# Patient Record
Sex: Male | Born: 1995
Health system: Southern US, Community
[De-identification: ages and names within clinical notes are randomized; demographics above are authoritative.]

## PROBLEM LIST (undated history)

## (undated) DIAGNOSIS — N200 Calculus of kidney: Secondary | ICD-10-CM

---
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
## 2017-01-17 ENCOUNTER — Encounter (HOSPITAL_COMMUNITY): Payer: Self-pay | Admitting: Emergency Medicine

## 2017-01-17 ENCOUNTER — Emergency Department (HOSPITAL_COMMUNITY): Payer: Managed Care, Other (non HMO)

## 2017-01-17 ENCOUNTER — Emergency Department (HOSPITAL_COMMUNITY)
Admission: EM | Admit: 2017-01-17 | Discharge: 2017-01-17 | Disposition: A | Discharge status: Home / Self Care | Payer: Managed Care, Other (non HMO) | Attending: Emergency Medicine | Admitting: Emergency Medicine

## 2017-01-17 DIAGNOSIS — Y9301 Activity, walking, marching and hiking: Secondary | ICD-10-CM | POA: Insufficient documentation

## 2017-01-17 DIAGNOSIS — Y929 Unspecified place or not applicable: Secondary | ICD-10-CM | POA: Insufficient documentation

## 2017-01-17 DIAGNOSIS — S92355A Nondisplaced fracture of fifth metatarsal bone, left foot, initial encounter for closed fracture: Secondary | ICD-10-CM | POA: Insufficient documentation

## 2017-01-17 DIAGNOSIS — X509XXA Other and unspecified overexertion or strenuous movements or postures, initial encounter: Secondary | ICD-10-CM | POA: Insufficient documentation

## 2017-01-17 DIAGNOSIS — Y999 Unspecified external cause status: Secondary | ICD-10-CM | POA: Insufficient documentation

## 2017-01-17 DIAGNOSIS — S93402A Sprain of unspecified ligament of left ankle, initial encounter: Secondary | ICD-10-CM

## 2017-01-17 MED ORDER — HYDROCODONE-ACETAMINOPHEN 5-325 MG PO TABS
1.0000 | ORAL_TABLET | Freq: Once | ORAL | Status: AC
  Administered 2017-01-17: 1 via ORAL
  Filled 2017-01-17: qty 1

## 2017-01-17 MED ORDER — IBUPROFEN 800 MG PO TABS: 800.0000 mg | ORAL_TABLET | Freq: Three times a day (TID) | ORAL | 0 refills | Status: DC | PRN

## 2017-01-17 MED ORDER — HYDROCODONE-ACETAMINOPHEN 5-325 MG PO TABS: 1.0000 | ORAL_TABLET | ORAL | 0 refills | Status: DC | PRN

## 2017-01-17 NOTE — ED Triage Notes (Signed)
Pt reports walking and stepped into ditch and felt left ankle pop. Pt states it feels like something is poking on side of foot. Pulses and sensation present.

## 2017-01-17 NOTE — Discharge Instructions (Signed)
Use ibuprofen up to 3 times daily as needed for mild to moderate pain. Take this with meals. Do not use other anti-inflammatories at the same time (Advil, Motrin, naproxen, Aleve). You may supplement with Tylenol.  You may use Norco as needed for severe pain. Use crutches to remain nonweightbearing until seen by orthopedics. Follow-up with orthopedics within one week for further evaluation of your foot.  Return to the emergency department if you develop numbness, tingling, or any new or worsening symptoms.

## 2017-01-18 NOTE — ED Provider Notes (Signed)
[facility]-EMERGENCY DEPT Provider Note   CSN: [id number] Arrival date & time: 01/17/17  1901     History   Chief Complaint Chief Complaint  Patient presents with  . Ankle Pain    HPI Francis Reyes is a 21 y.o. male presenting with pain and swelling of left ankle.  Patient states that he was walking home when he externally stepped into a ditch, and his foot inverted. He heard a pop on the lateral aspect of his left foot. Since then, he has had increasing pain of his left lateral foot and ankle. He denies numbness or tingling. He states he is unable to bear weight due to pain. He denies injury elsewhere. He has not taken anything for pain, and has not used any ice. While sitting still, patient is still in pain, although less than when he tries to bear weight or move his ankle.   HPI  Past Medical History:  Diagnosis Date  . Kidney stones     There are no active problems to display for this patient.   History reviewed. No pertinent surgical history.     Home Medications    Prior to Admission medications   Medication Sig Start Date End Date Taking? Authorizing Provider  HYDROcodone-acetaminophen (NORCO/VICODIN) 5-325 MG tablet Take 1 tablet by mouth every 4 (four) hours as needed for severe pain. 01/17/17   Harmonie Verrastro, PA-C  ibuprofen (ADVIL,MOTRIN) 800 MG tablet Take 1 tablet (800 mg total) by mouth 3 (three) times daily with meals as needed. 01/17/17   Cove Haydon, PA-C    Family History History reviewed. No pertinent family history.  Social History Social History  Substance Use Topics  . Smoking status: Never Smoker  . Smokeless tobacco: Never Used  . Alcohol use No     Allergies   Patient has no known allergies.   Review of Systems Review of Systems  Musculoskeletal: Positive for arthralgias and joint swelling.  Neurological: Negative for numbness.     Physical Exam Updated Vital Signs BP 136/72 (BP Location: Left Arm)   Pulse 78   Temp  98.2 F (36.8 C) (Oral)   Resp 14   Ht 6\' 1"  (1.854 m)   Wt 115.7 kg (255 lb)   SpO2 98%   BMI 33.64 kg/m   Physical Exam  Constitutional: He is oriented to person, place, and time. He appears well-developed and well-nourished. No distress.  HENT:  Head: Normocephalic and atraumatic.  Eyes: Pupils are equal, round, and reactive to light.  Neck: Normal range of motion.  Cardiovascular: Normal rate and regular rhythm.   Pulmonary/Chest: Effort normal and breath sounds normal.  Musculoskeletal:  Patient with obvious swelling of lateral left foot and surrounding the lateral malleolus of the left ankle. No obvious ecchymosis. Tenderness to palpation of the lateral foot and ankle. No tenderness to palpation of plantar aspect of the foot, toes, Achilles tendon, or medial foot. Decreased range of motion of the ankle due to pain. Full range of motion of toes with minimal pain. Pulses intact bilaterally. Color and warmth equal bilaterally. Sensation intact bilaterally. Compartments soft.  Neurological: He is alert and oriented to person, place, and time. No sensory deficit.  Skin: Skin is warm.  Nursing note and vitals reviewed.    ED Treatments / Results  Labs (all labs ordered are listed, but only abnormal results are displayed) Labs Reviewed - No data to display  EKG  EKG Interpretation None       Radiology Dg Ankle  Complete Left  Result Date: 01/17/2017 CLINICAL DATA:  21 year old male with pain and swelling of the ankle after he tripped in a ditch earlier tonight. EXAM: LEFT ANKLE COMPLETE - 3+ VIEW COMPARISON:  Concurrently obtained radiographs of the left foot FINDINGS: Acute displaced avulsion fracture from the base of the fifth metatarsal. The distal fibula and tibia appear intact. The ankle mortise is congruent in the talar dome is intact. There is diffuse soft tissue swelling about the dorsal and lateral aspect of the ankle and hindfoot. IMPRESSION: Acute displaced avulsion  fracture from the base of the fifth metatarsal. Electronically Signed   By: Malachy MoanHeath  McCullough M.D.   On: 01/17/2017 20:32   Dg Foot Complete Left  Result Date: 01/17/2017 CLINICAL DATA:  21 year old male with left foot pain after tripping in a ditch beside the road earlier tonight EXAM: LEFT FOOT - COMPLETE 3+ VIEW COMPARISON:  Concurrently obtained radiographs of the left ankle FINDINGS: Acute and mildly comminuted fracture at the base of the fifth metatarsal consistent with an avulsion type injury from the peroneal tendons. The remaining visualized bones and joints are intact and unremarkable. Associated soft tissue is present over the lateral and dorsal aspect of the foot. IMPRESSION: Acute comminuted avulsion fracture from the base of the fifth metatarsal. Electronically Signed   By: Malachy MoanHeath  McCullough M.D.   On: 01/17/2017 20:33    Procedures Procedures (including critical care time)  Medications Ordered in ED Medications  HYDROcodone-acetaminophen (NORCO/VICODIN) 5-325 MG per tablet 1 tablet (1 tablet Oral Given 01/17/17 2246)     Initial Impression / Assessment and Plan / ED Course  I have reviewed the triage vital signs and the nursing notes.  Pertinent labs & imaging results that were available during my care of the patient were reviewed by me and considered in my medical decision making (see chart for details).     Patient presenting with left foot and ankle pain following inversion of the foot. Physical exam with swelling and tenderness to this area. X-ray showed avulsion fracture fifth metatarsal. Physical exam without tenderness to the plantar aspect of the foot, and low concern for Lisfranc at this time. Did not show any further fracture or injury to the ankle or fibula. Patient likely with concurrent ankle sprain. Patient neurovascularly intact with soft compartments. Will put patient in Cam Walker and crutches for nonweightbearing until seen by orthopedics. One dose of pain  medicine given here, and short course prescribed for home use. Patient told to use ibuprofen as needed for mild to moderate pain. Patient follow-up with orthopedics for further evaluation of his ankle. Findings discussed with patient. Return precautions given. Patient states he understands and agrees to plan.  Final Clinical Impressions(s) / ED Diagnoses   Final diagnoses:  Nondisp fx of fifth metatarsal bone, left foot, init  Sprain of left ankle, unspecified ligament, initial encounter    New Prescriptions Discharge Medication List as of 01/17/2017 10:42 PM    START taking these medications   Details  HYDROcodone-acetaminophen (NORCO/VICODIN) 5-325 MG tablet Take 1 tablet by mouth every 4 (four) hours as needed for severe pain., Starting Mon 01/17/2017, Print    ibuprofen (ADVIL,MOTRIN) 800 MG tablet Take 1 tablet (800 mg total) by mouth 3 (three) times daily with meals as needed., Starting Mon 01/17/2017, Print         [place] Madera[place]avale, [place]villeSoph[place], PA-C 01/18/17 0158    Nira Connardama, Pedro Eduardo, MD 01/18/17 [phone]

## 2018-03-16 IMAGING — CR DG ANKLE COMPLETE 3+V*L*
3 series · 3 of 3 positions shown · non-contrast
Comparison: Concurrently obtained radiographs of the left foot

CLINICAL DATA: 20-year-old male with pain and swelling of the ankle
after he tripped in a ditch earlier tonight.

EXAM:
LEFT ANKLE COMPLETE - 3+ VIEW

[x ankle lat left]
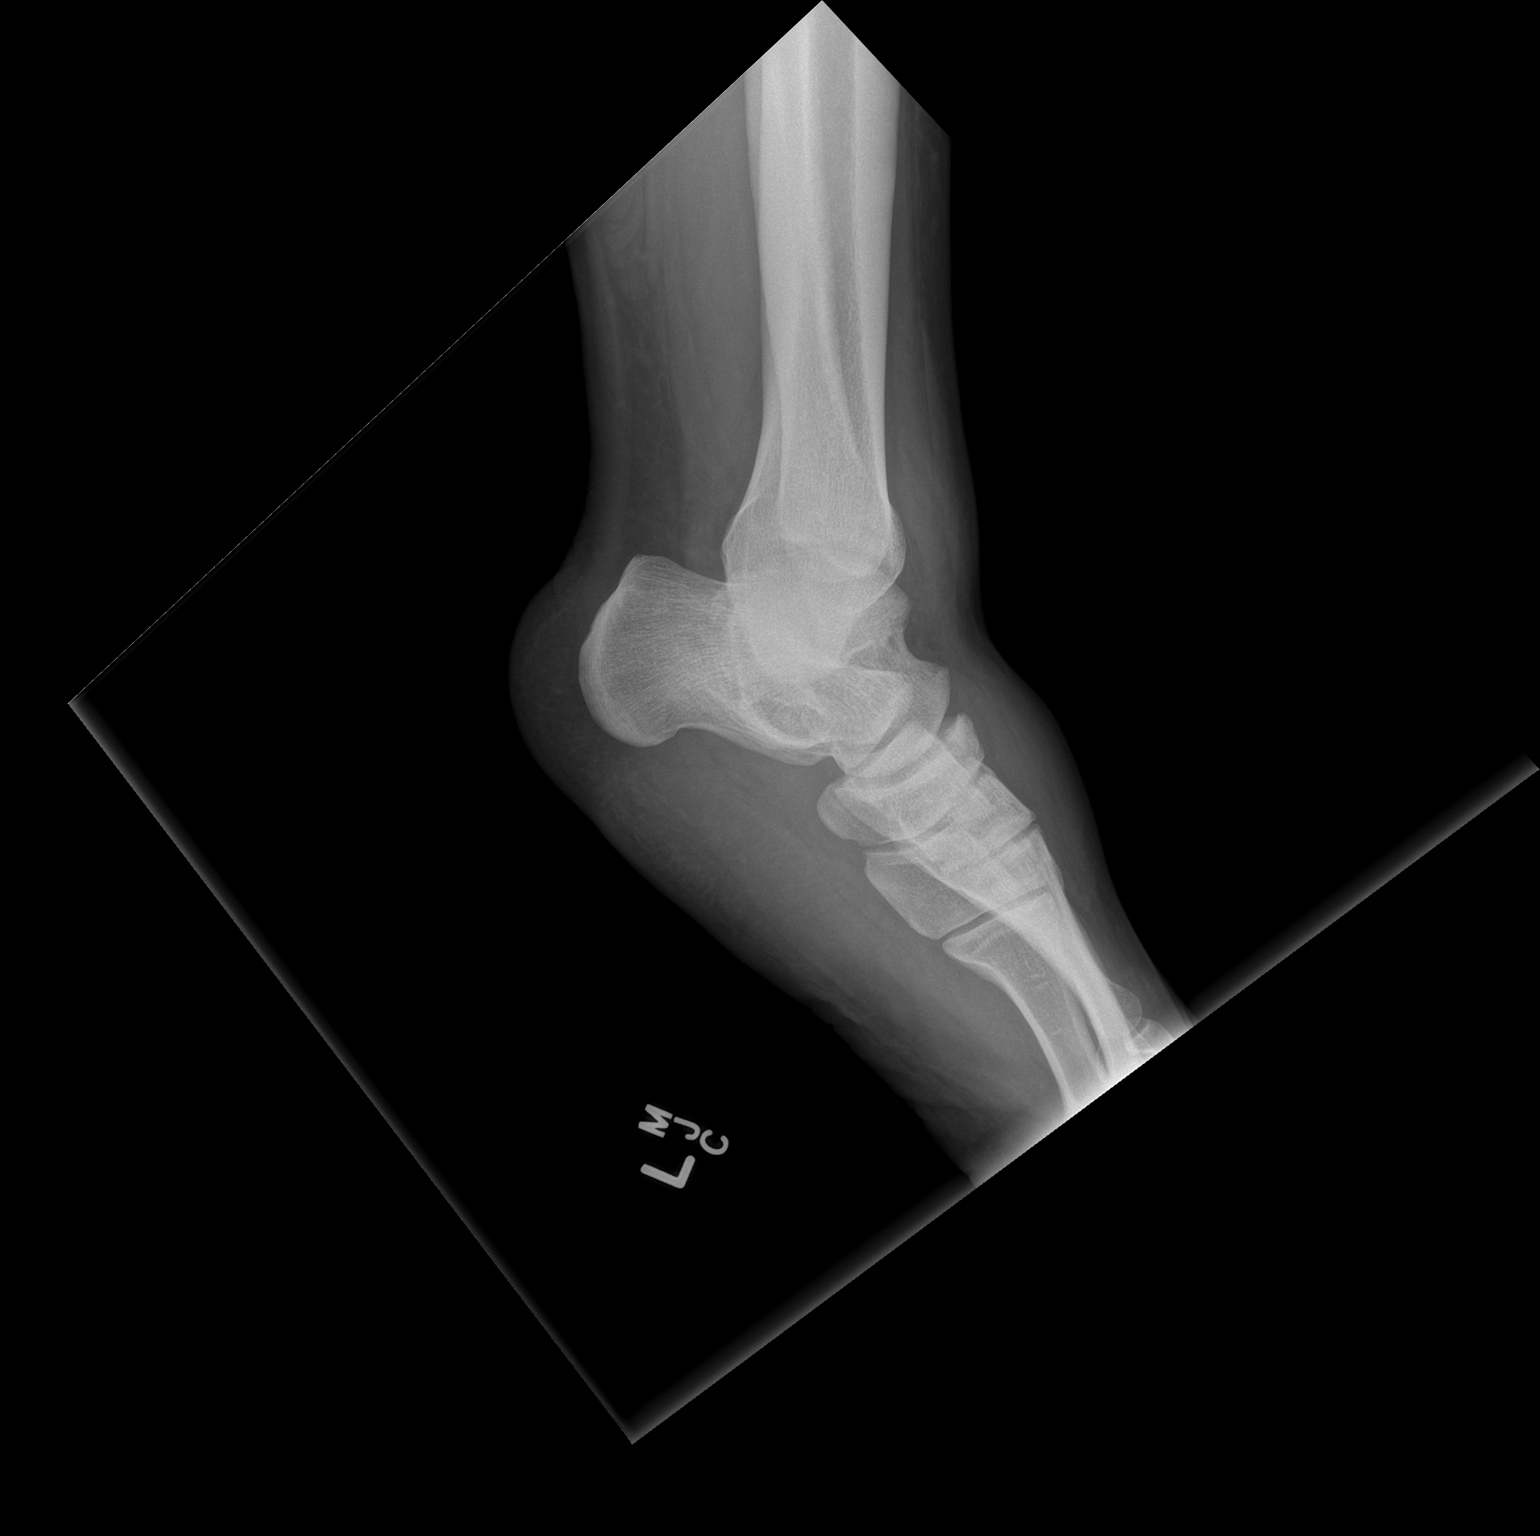

[x ankle ap left]
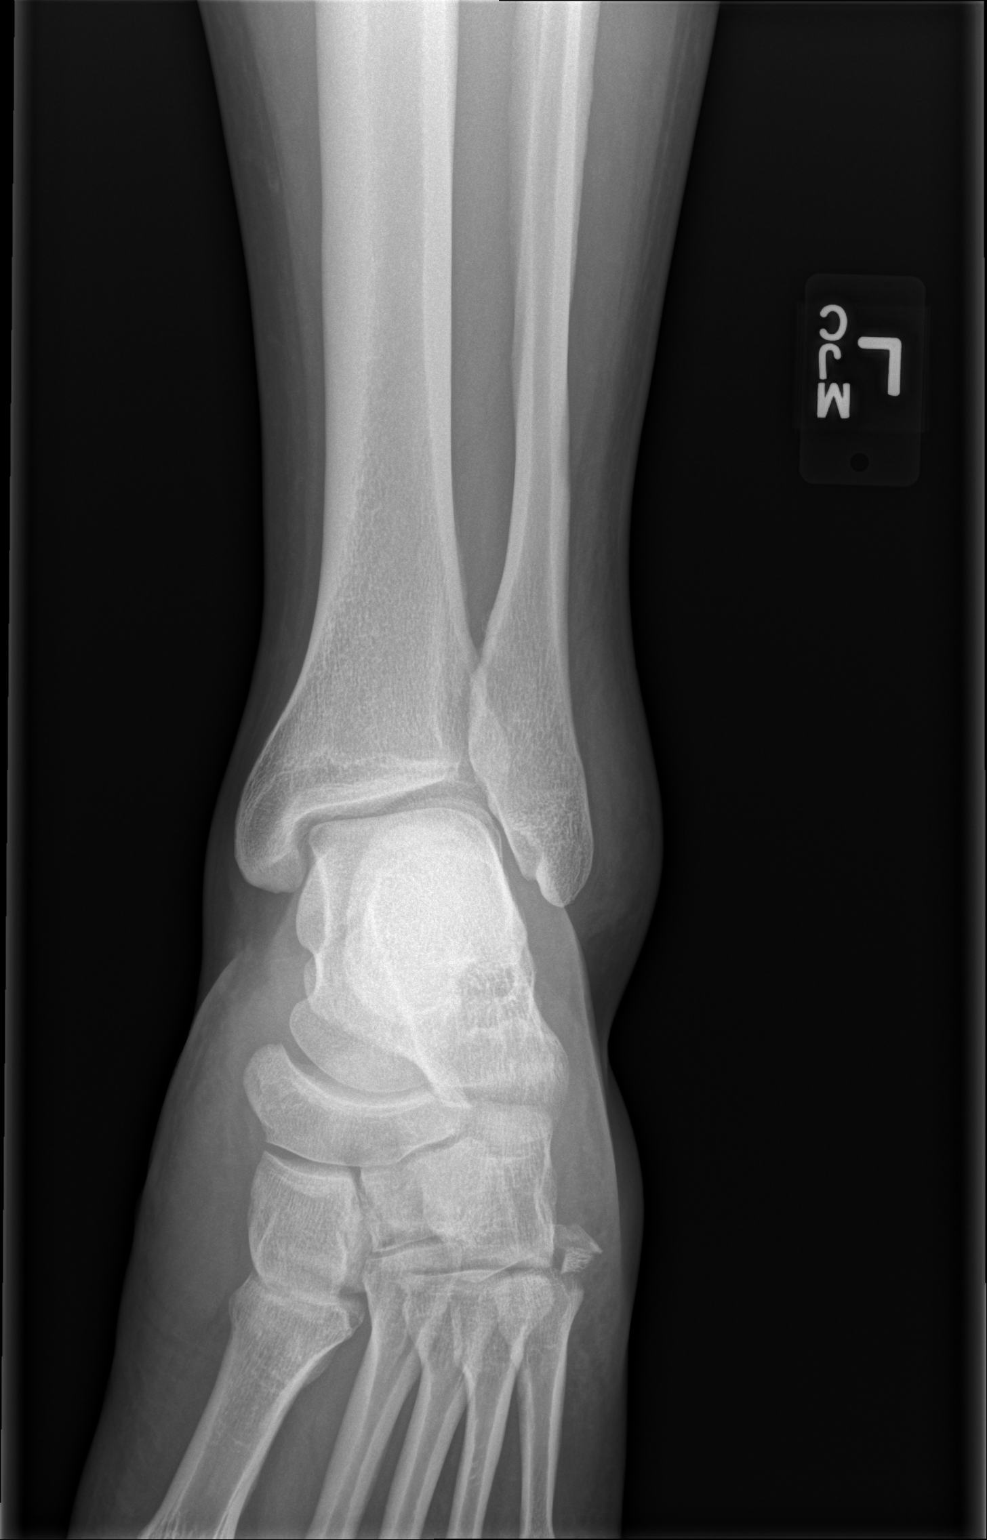

[x ankle obl left]
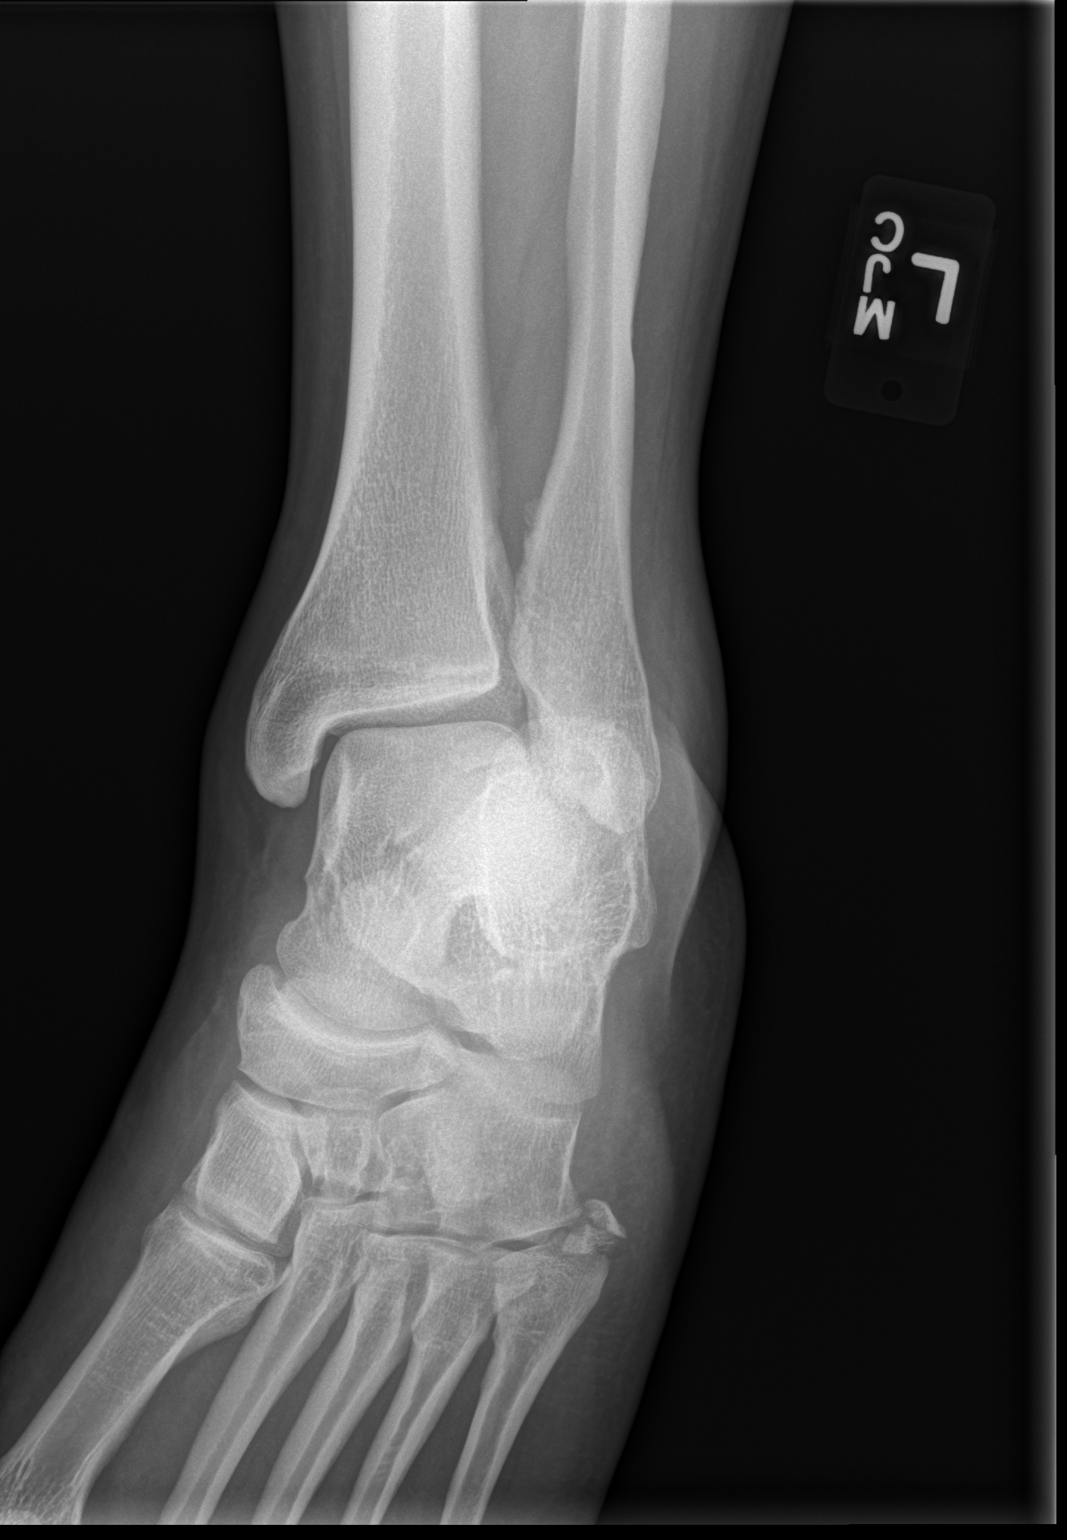

[3 of 3 positions shown; findings below may reference images not displayed]

FINDINGS: Acute displaced avulsion fracture from the base of the fifth
metatarsal. The distal fibula and tibia appear intact. The ankle
mortise is congruent in the talar dome is intact. There is diffuse
soft tissue swelling about the dorsal and lateral aspect of the
ankle and hindfoot.
IMPRESSION: Acute displaced avulsion fracture from the base of the fifth
metatarsal.

## 2018-03-16 IMAGING — CR DG FOOT COMPLETE 3+V*L*
3 series · 3 of 3 positions shown · non-contrast
Comparison: Concurrently obtained radiographs of the left ankle

CLINICAL DATA: 20-year-old male with left foot pain after tripping
in a ditch beside the road earlier tonight

EXAM:
LEFT FOOT - COMPLETE 3+ VIEW

[x foot ap left]
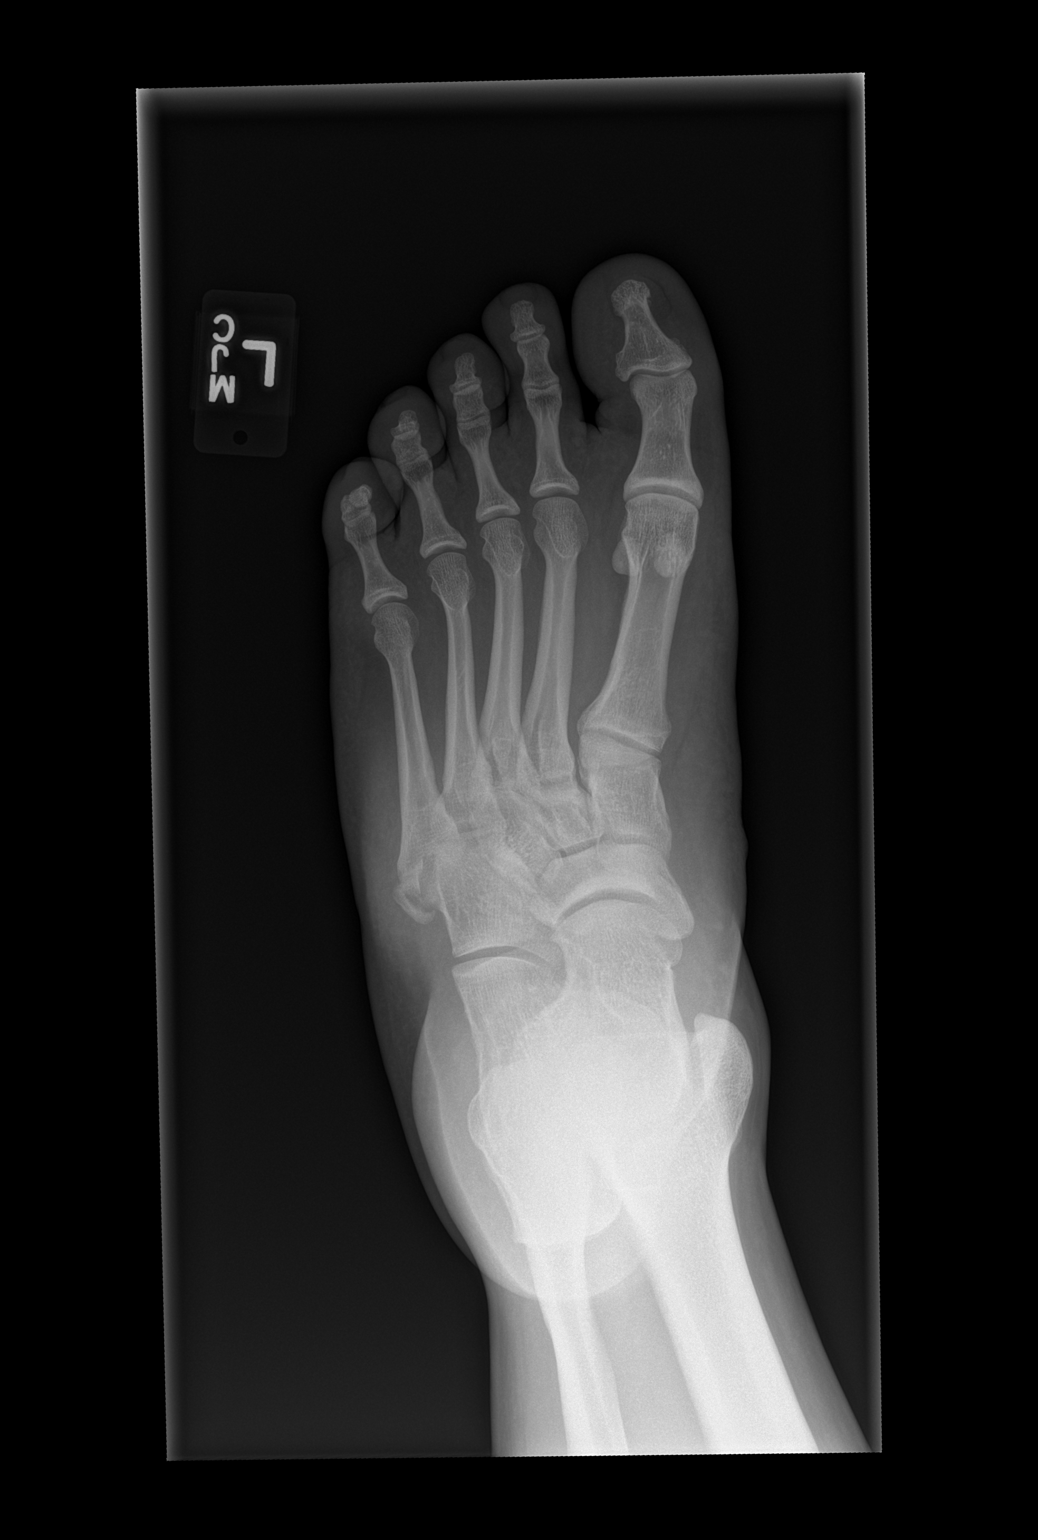

[x foot obl left]
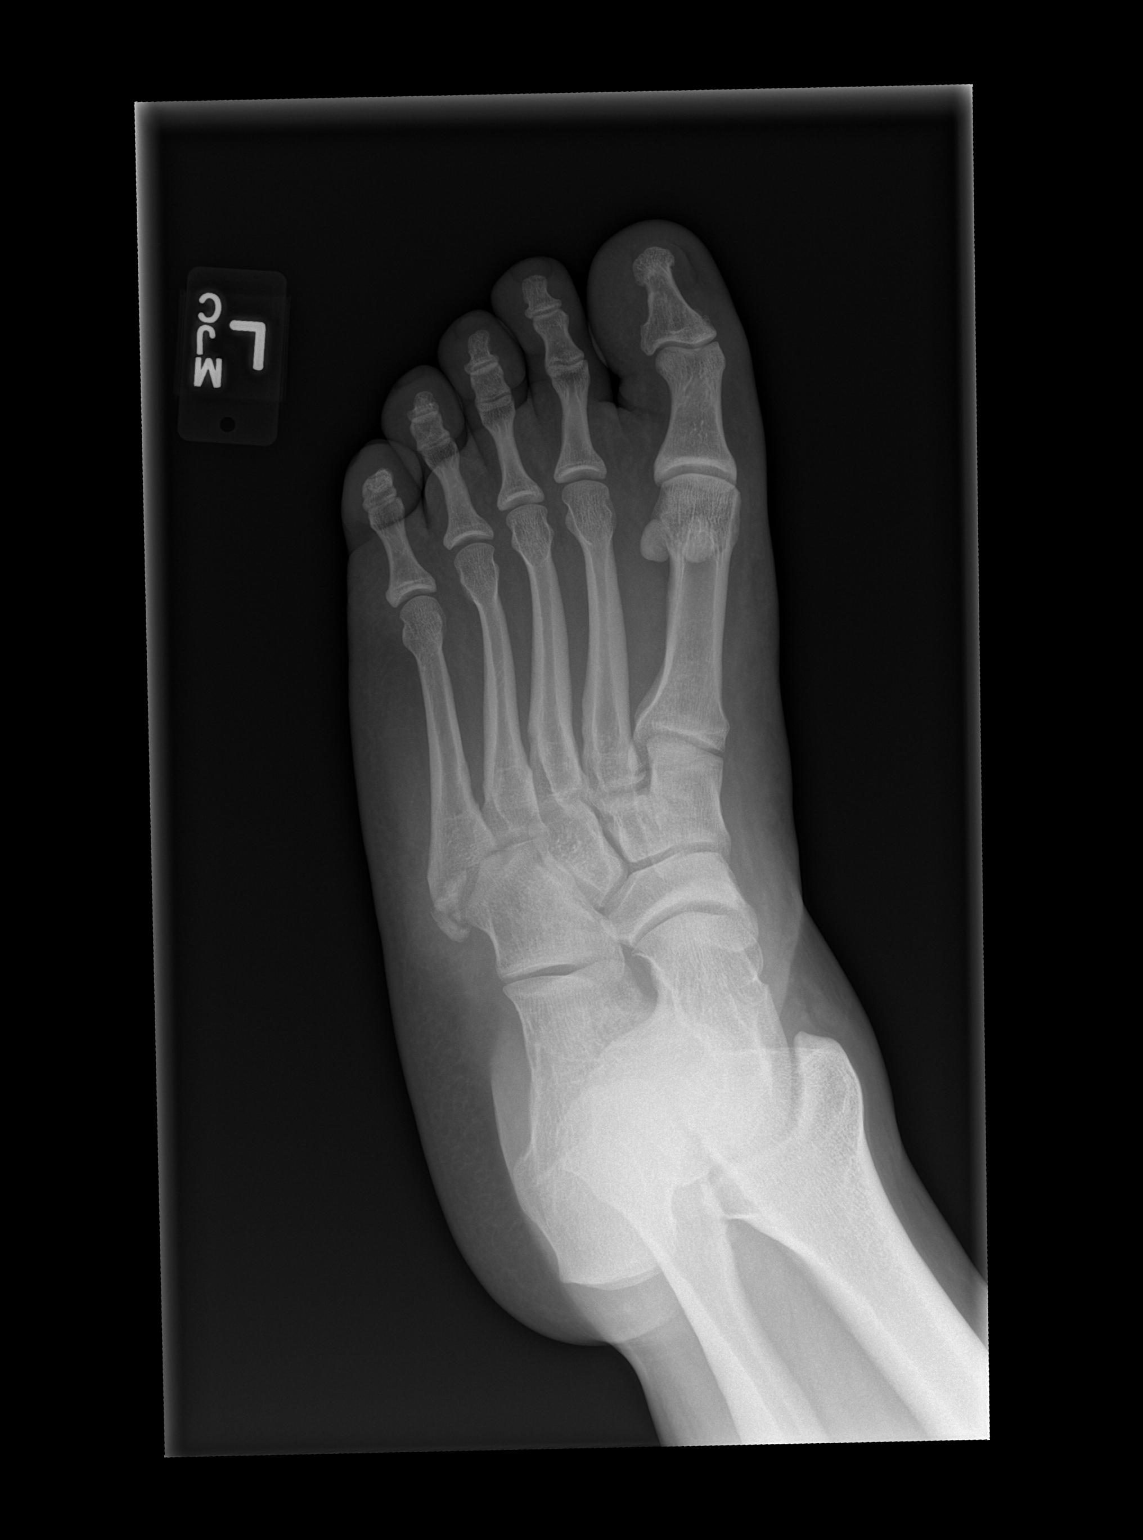

[x foot lat left]
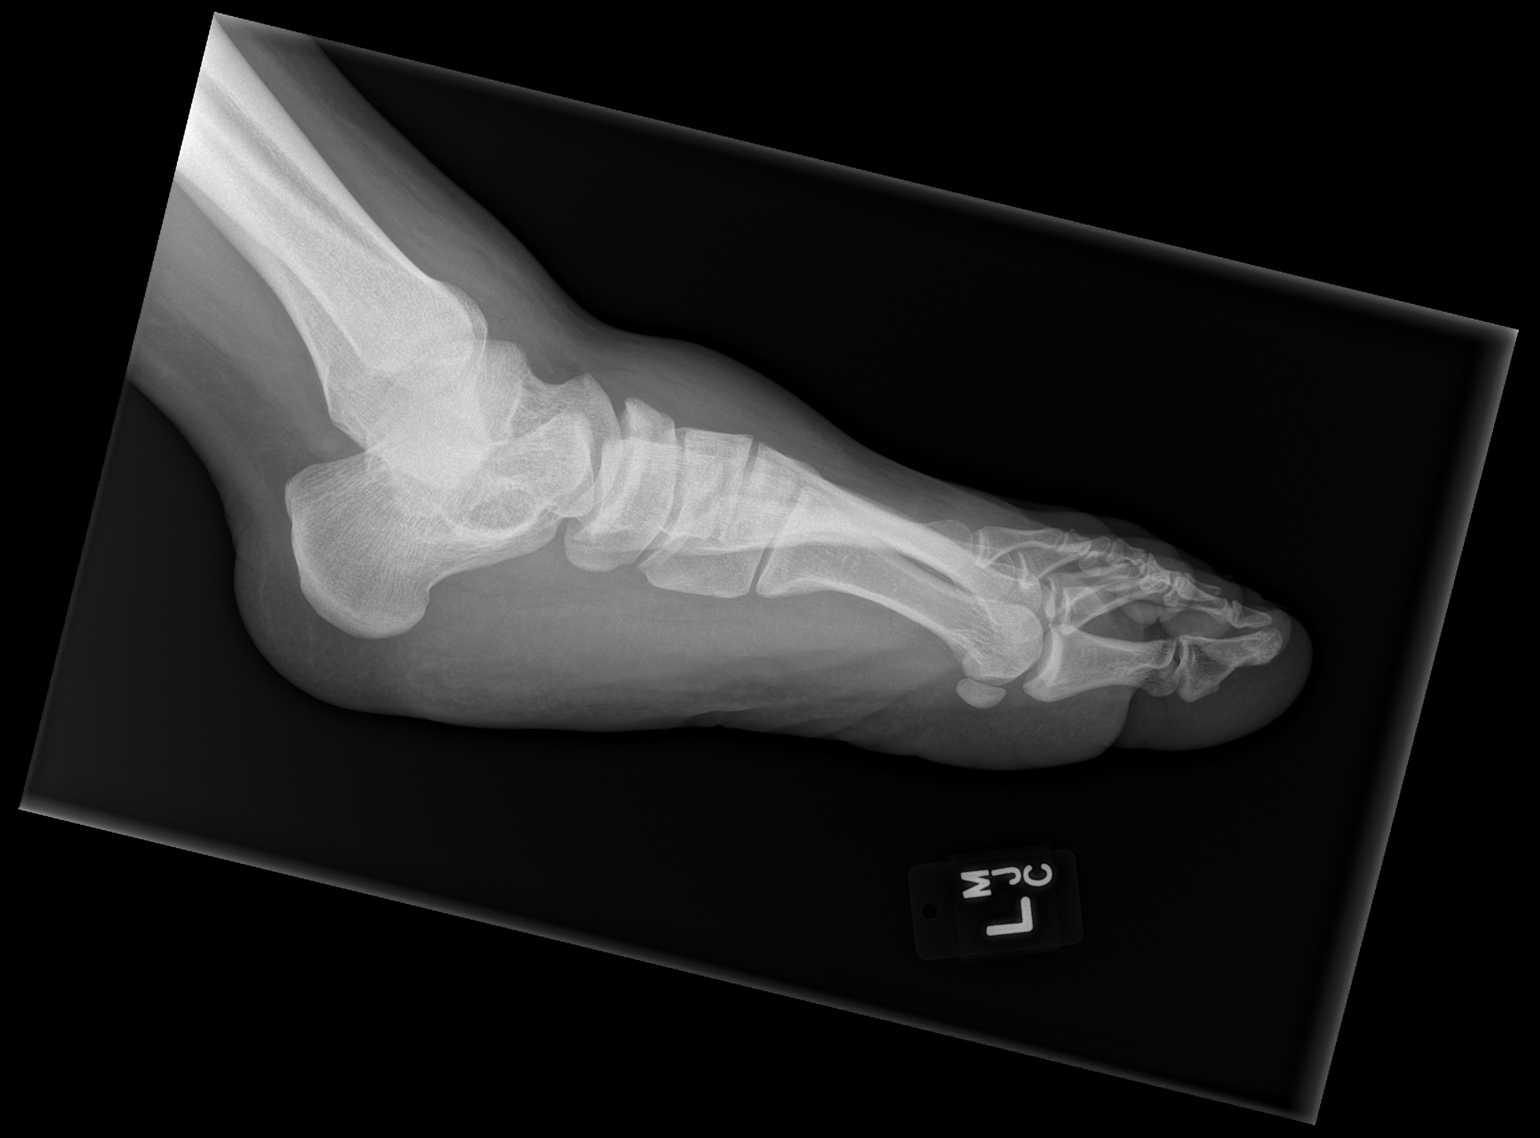

[3 of 3 positions shown; findings below may reference images not displayed]

FINDINGS: Acute and mildly comminuted fracture at the base of the fifth
metatarsal consistent with an avulsion type injury from the peroneal
tendons. The remaining visualized bones and joints are intact and
unremarkable. Associated soft tissue is present over the lateral and
dorsal aspect of the foot.
IMPRESSION: Acute comminuted avulsion fracture from the base of the fifth
metatarsal.

## 2018-08-18 ENCOUNTER — Encounter (HOSPITAL_COMMUNITY): Payer: Self-pay

## 2018-08-18 ENCOUNTER — Ambulatory Visit (HOSPITAL_COMMUNITY)
Admission: EM | Admit: 2018-08-18 | Discharge: 2018-08-18 | Disposition: A | Discharge status: Home / Self Care | Payer: Managed Care, Other (non HMO) | Attending: Family Medicine | Admitting: Family Medicine

## 2018-08-18 DIAGNOSIS — B349 Viral infection, unspecified: Secondary | ICD-10-CM | POA: Diagnosis not present

## 2018-08-18 DIAGNOSIS — J3489 Other specified disorders of nose and nasal sinuses: Secondary | ICD-10-CM

## 2018-08-18 MED ORDER — PREDNISONE 50 MG PO TABS: 50.0000 mg | ORAL_TABLET | Freq: Every day | ORAL | 0 refills | Status: DC

## 2018-08-18 MED ORDER — OXYMETAZOLINE HCL 0.05 % NA SOLN: 1.0000 | Freq: Two times a day (BID) | NASAL | 0 refills | Status: DC

## 2018-08-18 MED ORDER — FLUTICASONE PROPIONATE 50 MCG/ACT NA SUSP
2.0000 | Freq: Every day | NASAL | 0 refills | Status: DC
Start: 2018-08-18 — End: 2023-05-30

## 2018-08-18 NOTE — Discharge Instructions (Signed)
Start prednisone for sinus pressure/headache. Afrin for nasal congestion, use sparingly for the next 1-3 days. As discussed, if used more than 3 days, will cause rebound congestion. Start flonase for nasal congestion/drainage. You can use over the counter nasal saline rinse such as neti pot for nasal congestion. Keep hydrated, your urine should be clear to pale yellow in color. Tylenol/motrin for fever and pain. Monitor for any worsening of symptoms, chest pain, shortness of breath, wheezing, swelling of the throat, follow up for reevaluation.

## 2018-08-18 NOTE — ED Triage Notes (Signed)
Pt present coughing, nasal drainage / congestion . Symptoms started on Wednesday

## 2018-08-18 NOTE — ED Provider Notes (Signed)
[facility]    CSN: [id number] Arrival date & time: 08/18/18  1821     History   Chief Complaint Chief Complaint  Patient presents with  . Migraine  . Nasal Congestion    HPI Francis Reyes is a 23 y.o. male.   23 year old male comes in for 3 day history of URI symptoms. Has had cough, rhinorrhea, nasal congestion, headache. Chills, body aches. States headaches has been slightly improved. Frontal headache, pounding sensation, photophobia. No nausea, vomiting. States feel shortness of breath at work due to dust and nasal congestion. Has tried dayquil without relief. Never smoker. Positive sick contact.      Past Medical History:  Diagnosis Date  . Kidney stones     There are no active problems to display for this patient.   History reviewed. No pertinent surgical history.     Home Medications    Prior to Admission medications   Medication Sig Start Date End Date Taking? Authorizing Provider  fluticasone (FLONASE) 50 MCG/ACT nasal spray Place 2 sprays into both nostrils daily. 08/18/18   Cathie Hoops, Turon Kilmer V, PA-C  oxymetazoline (AFRIN NASAL SPRAY) 0.05 % nasal spray Place 1 spray into both nostrils 2 (two) times daily. 08/18/18   Cathie Hoops, Alycen Mack V, PA-C  predniSONE (DELTASONE) 50 MG tablet Take 1 tablet (50 mg total) by mouth daily. 08/18/18   Belinda Fisher, PA-C    Family History History reviewed. No pertinent family history.  Social History Social History   Tobacco Use  . Smoking status: Never Smoker  . Smokeless tobacco: Never Used  Substance Use Topics  . Alcohol use: No  . Drug use: No     Allergies   Patient has no known allergies.   Review of Systems Review of Systems  Reason unable to perform ROS: See HPI as above.     Physical Exam Triage Vital Signs ED Triage Vitals  Enc Vitals Group     BP 08/18/18 1837 125/70     Pulse Rate 08/18/18 1837 91     Resp 08/18/18 1837 18     Temp 08/18/18 1837 98.6 F (37 C)     Temp Source 08/18/18 1837  Oral     SpO2 08/18/18 1837 100 %     Weight --      Height --      Head Circumference --      Peak Flow --      Pain Score 08/18/18 1838 6     Pain Loc --      Pain Edu? --      Excl. in GC? --    No data found.  Updated Vital Signs BP 125/70 (BP Location: Right Arm)   Pulse 91   Temp 98.6 F (37 C) (Oral)   Resp 18   SpO2 100%   Physical Exam Constitutional:      General: He is not in acute distress.    Appearance: He is well-developed. He is not ill-appearing, toxic-appearing or diaphoretic.  HENT:     Head: Normocephalic and atraumatic.     Right Ear: Tympanic membrane, ear canal and external ear normal. Tympanic membrane is not erythematous or bulging.     Left Ear: Tympanic membrane, ear canal and external ear normal. Tympanic membrane is not erythematous or bulging.     Nose: Congestion and rhinorrhea present.     Left Turbinates: Swollen.     Right Sinus: Maxillary sinus tenderness and frontal sinus tenderness present.  Left Sinus: Maxillary sinus tenderness and frontal sinus tenderness present.     Mouth/Throat:     Mouth: Mucous membranes are moist.     Pharynx: Oropharynx is clear. Uvula midline.  Eyes:     Conjunctiva/sclera: Conjunctivae normal.     Pupils: Pupils are equal, round, and reactive to light.  Neck:     Musculoskeletal: Normal range of motion and neck supple.  Cardiovascular:     Rate and Rhythm: Normal rate and regular rhythm.     Heart sounds: Normal heart sounds. No murmur. No friction rub. No gallop.   Pulmonary:     Effort: Pulmonary effort is normal.     Breath sounds: Normal breath sounds. No decreased breath sounds, wheezing, rhonchi or rales.  Lymphadenopathy:     Cervical: No cervical adenopathy.  Skin:    General: Skin is warm and dry.  Neurological:     Mental Status: He is alert and oriented to person, place, and time.  Psychiatric:        Behavior: Behavior normal.        Judgment: Judgment normal.      UC  Treatments / Results  Labs (all labs ordered are listed, but only abnormal results are displayed) Labs Reviewed - No data to display  EKG None  Radiology No results found.  Procedures Procedures (including critical care time)  Medications Ordered in UC Medications - No data to display  Initial Impression / Assessment and Plan / UC Course  I have reviewed the triage vital signs and the nursing notes.  Pertinent labs & imaging results that were available during my care of the patient were reviewed by me and considered in my medical decision making (see chart for details).    Discussed with patient history and exam most consistent with viral URI. Symptomatic treatment as needed. Push fluids. Return precautions given.   Final Clinical Impressions(s) / UC Diagnoses   Final diagnoses:  Viral illness  Sinus pressure    ED Prescriptions    Medication Sig Dispense Auth. Provider   predniSONE (DELTASONE) 50 MG tablet Take 1 tablet (50 mg total) by mouth daily. 5 tablet Charma Mocarski V, PA-C   oxymetazoline (AFRIN NASAL SPRAY) 0.05 % nasal spray Place 1 spray into both nostrils 2 (two) times daily. 30 mL Klaire Court V, PA-C   fluticasone (FLONASE) 50 MCG/ACT nasal spray Place 2 sprays into both nostrils daily. 1 g Threasa Alpha, [place] 08/18/18 [id number]

## 2018-09-13 ENCOUNTER — Encounter (HOSPITAL_COMMUNITY): Payer: Self-pay

## 2018-09-13 ENCOUNTER — Emergency Department (HOSPITAL_COMMUNITY)
Admission: EM | Admit: 2018-09-13 | Discharge: 2018-09-13 | Disposition: A | Discharge status: Home / Self Care | Payer: Managed Care, Other (non HMO) | Attending: Emergency Medicine | Admitting: Emergency Medicine

## 2018-09-13 ENCOUNTER — Other Ambulatory Visit: Payer: Self-pay

## 2018-09-13 DIAGNOSIS — J111 Influenza due to unidentified influenza virus with other respiratory manifestations: Secondary | ICD-10-CM | POA: Insufficient documentation

## 2018-09-13 DIAGNOSIS — R05 Cough: Secondary | ICD-10-CM | POA: Diagnosis not present

## 2018-09-13 DIAGNOSIS — M7918 Myalgia, other site: Secondary | ICD-10-CM | POA: Diagnosis not present

## 2018-09-13 DIAGNOSIS — J029 Acute pharyngitis, unspecified: Secondary | ICD-10-CM | POA: Diagnosis present

## 2018-09-13 LAB — GROUP A STREP BY PCR: Group A Strep by PCR: NOT DETECTED

## 2018-09-13 MED ORDER — IBUPROFEN 800 MG PO TABS: 800.0000 mg | ORAL_TABLET | Freq: Three times a day (TID) | ORAL | 0 refills | Status: DC | PRN

## 2018-09-13 MED ORDER — ACETAMINOPHEN-CODEINE 120-12 MG/5ML PO SOLN: 10.0000 mL | ORAL | 0 refills | Status: DC | PRN

## 2018-09-13 MED ORDER — GUAIFENESIN ER 1200 MG PO TB12: 1.0000 | ORAL_TABLET | Freq: Two times a day (BID) | ORAL | 0 refills | Status: DC

## 2018-09-13 NOTE — ED Provider Notes (Signed)
[facility] EMERGENCY DEPARTMENT Provider Note   CSN: [id number] Arrival date & time: 09/13/18  2117    History   Chief Complaint Chief Complaint  Patient presents with  . Sore Throat    HPI Francis Reyes is a 23 y.o. male.     HPI Patient presents to the emergency department with sore throat, body aches and occasional cough with mucus production over the last 12 hours.  Patient states that when he was at work today got more body aches and fatigue as the day went on.  Patient states he did not take any medications prior to arrival for his symptoms.  States that his brother-in-law recently was diagnosed with the flu.  The patient denies chest pain, shortness of breath, headache,blurred vision, neck pain, fever,weakness, numbness, dizziness, anorexia, edema, abdominal pain, nausea, vomiting, diarrhea, rash, back pain, dysuria, hematemesis, bloody stool, near syncope, or syncope. Past Medical History:  Diagnosis Date  . Kidney stones     There are no active problems to display for this patient.   History reviewed. No pertinent surgical history.      Home Medications    Prior to Admission medications   Medication Sig Start Date End Date Taking? Authorizing Provider  fluticasone (FLONASE) 50 MCG/ACT nasal spray Place 2 sprays into both nostrils daily. 08/18/18   Cathie Hoops, Amy V, PA-C  oxymetazoline (AFRIN NASAL SPRAY) 0.05 % nasal spray Place 1 spray into both nostrils 2 (two) times daily. 08/18/18   Cathie Hoops, Amy V, PA-C  predniSONE (DELTASONE) 50 MG tablet Take 1 tablet (50 mg total) by mouth daily. 08/18/18   Belinda Fisher, PA-C    Family History History reviewed. No pertinent family history.  Social History Social History   Tobacco Use  . Smoking status: Never Smoker  . Smokeless tobacco: Never Used  Substance Use Topics  . Alcohol use: No  . Drug use: No     Allergies   Patient has no known allergies.   Review of Systems Review of Systems All other  systems negative except as documented in the HPI. All pertinent positives and negatives as reviewed in the HPI.  Physical Exam Updated Vital Signs BP 132/68 (BP Location: Right Arm)   Pulse (!) 111   Temp 98.3 F (36.8 C) (Oral)   Resp 20   Ht 6' (1.829 m)   Wt 122.5 kg   SpO2 99%   BMI 36.62 kg/m   Physical Exam Vitals signs and nursing note reviewed.  Constitutional:      General: He is not in acute distress.    Appearance: He is well-developed.  HENT:     Head: Normocephalic and atraumatic.     Right Ear: Tympanic membrane normal.     Left Ear: Tympanic membrane normal.     Mouth/Throat:     Pharynx: Uvula midline. Pharyngeal swelling and posterior oropharyngeal erythema present.     Tonsils: Swelling: 2+ on the right. 2+ on the left.  Eyes:     Pupils: Pupils are equal, round, and reactive to light.  Neck:     Musculoskeletal: Normal range of motion and neck supple.  Cardiovascular:     Rate and Rhythm: Normal rate and regular rhythm.     Heart sounds: Normal heart sounds. No murmur. No friction rub. No gallop.   Pulmonary:     Effort: Pulmonary effort is normal. No respiratory distress.     Breath sounds: Normal breath sounds. No wheezing.  Abdominal:  General: Bowel sounds are normal. There is no distension.     Palpations: Abdomen is soft.     Tenderness: There is no abdominal tenderness.  Skin:    General: Skin is warm and dry.     Capillary Refill: Capillary refill takes less than 2 seconds.     Findings: No erythema or rash.  Neurological:     Mental Status: He is alert and oriented to person, place, and time.     Motor: No abnormal muscle tone.     Coordination: Coordination normal.  Psychiatric:        Behavior: Behavior normal.      ED Treatments / Results  Labs (all labs ordered are listed, but only abnormal results are displayed) Labs Reviewed  GROUP A STREP BY PCR    EKG None  Radiology No results found.  Procedures Procedures  (including critical care time)  Medications Ordered in ED Medications - No data to display   Initial Impression / Assessment and Plan / ED Course  I have reviewed the triage vital signs and the nursing notes.  Pertinent labs & imaging results that were available during my care of the patient were reviewed by me and considered in my medical decision making (see chart for details).        Patient most likely has an influenza-like illness.  I have advised him to rest and increase his fluid intake.  Told him to return to the emergency department for any worsening in his condition.  This is further evidenced by the fact that he recently had a family member that was diagnosed with the flu.  Stable here in the emergency department.  I feel that he is safe to be discharged home at this time.  Final Clinical Impressions(s) / ED Diagnoses   Final diagnoses:  None    ED Discharge Orders    None       Charlestine Night, Cordelia Poche 09/13/18 2327    Wynetta Fines, MD 09/18/18 [phone]

## 2018-09-13 NOTE — Discharge Instructions (Addendum)
Return here as needed.  Increase your fluid intake and rest as much as possible.  °

## 2018-09-13 NOTE — ED Triage Notes (Signed)
PT arrives POV for eval of sore throat, myalgias and chills onset this AM, progressively worsening throughout the day. Pt denies recent travel, denies sick contacts. Pt endorses subjective fevers. NAD/NARD

## 2019-04-12 ENCOUNTER — Telehealth: Payer: Managed Care, Other (non HMO) | Admitting: Physician Assistant

## 2019-04-12 DIAGNOSIS — Z20822 Contact with and (suspected) exposure to covid-19: Secondary | ICD-10-CM

## 2019-04-12 DIAGNOSIS — Z20828 Contact with and (suspected) exposure to other viral communicable diseases: Secondary | ICD-10-CM

## 2019-04-12 NOTE — Progress Notes (Signed)
I have spent 5 minutes in review of e-visit questionnaire, review and updating patient chart, medical decision making and response to patient.   Sheretha Shadd Cody Avaiah Stempel, PA-C    

## 2019-04-12 NOTE — Progress Notes (Signed)
Patient submitted new questionnaire. Will make this erroneous encounter.

## 2019-04-12 NOTE — Progress Notes (Signed)
E-Visit for Corona Virus Screening   Your current symptoms could be consistent with the coronavirus.  Many health care providers can now test patients at their office but not all are.  [facility] has multiple testing sites. For information on our COVID testing locations and hours go to [email or web address]  Please quarantine yourself while awaiting your test results.  We are enrolling you in our [place] for [place] . Daily you will receive a questionnaire within the [place] website. Our COVID 19 response team willl be monitoriing your responses daily.    COVID-19 is a respiratory illness with symptoms that are similar to the flu. Symptoms are typically mild to moderate, but there have been cases of severe illness and death due to the virus. The following symptoms may appear 2-14 days after exposure: . Fever . Cough . Shortness of breath or difficulty breathing . Chills . Repeated shaking with chills . Muscle pain . Headache . Sore throat . New loss of taste or smell . Fatigue . Congestion or runny nose . Nausea or vomiting . Diarrhea  It is vitally important that if you feel that you have an infection such as this virus or any other virus that you stay home and away from places where you may spread it to others.  You should self-quarantine for 14 days if you have symptoms that could potentially be coronavirus or have been in close contact a with a person diagnosed with COVID-19 within the last 2 weeks. You should avoid contact with people age 49 and older.   You should wear a mask or cloth face covering over your nose and mouth if you must be around other people or animals, including pets (even at home). Try to stay at least 6 feet away from other people. This will protect the people around you.  You can use medication such as Delsym or Mucinex-DM for cough  You may also take acetaminophen (Tylenol) as needed for fever.   Reduce your risk of  any infection by using the same precautions used for avoiding the common cold or flu:  Marland Kitchen Wash your hands often with soap and warm water for at least 20 seconds.  If soap and water are not readily available, use an alcohol-based hand sanitizer with at least 60% alcohol.  . If coughing or sneezing, cover your mouth and nose by coughing or sneezing into the elbow areas of your shirt or coat, into a tissue or into your sleeve (not your hands). . Avoid shaking hands with others and consider head nods or verbal greetings only. . Avoid touching your eyes, nose, or mouth with unwashed hands.  . Avoid close contact with people who are sick. . Avoid places or events with large numbers of people in one location, like concerts or sporting events. . Carefully consider travel plans you have or are making. . If you are planning any travel outside or inside the [place], visit the CDC's Travelers' Health webpage for the latest health notices. . If you have some symptoms but not all symptoms, continue to monitor at home and seek medical attention if your symptoms worsen. . If you are having a medical emergency, call 911.  HOME CARE . Only take medications as instructed by your medical team. . Drink plenty of fluids and get plenty of rest. . A steam or ultrasonic humidifier can help if you have congestion.   GET HELP RIGHT AWAY IF YOU HAVE EMERGENCY WARNING SIGNS** FOR COVID-19. If you or someone  is showing any of these signs seek emergency medical care immediately. Call 911 or proceed to your closest emergency facility if: . You develop worsening high fever. . Trouble breathing . Bluish lips or face . Persistent pain or pressure in the chest . New confusion . Inability to wake or stay awake . You cough up blood. . Your symptoms become more severe  **This list is not all possible symptoms. Contact your medical provider for any symptoms that are sever or concerning to you.   MAKE SURE YOU   Understand these  instructions.  Will watch your condition.  Will get help right away if you are not doing well or get worse.  Your e-visit answers were reviewed by a board certified advanced clinical practitioner to complete your personal care plan.  Depending on the condition, your plan could have included both over the counter or prescription medications.  If there is a problem please reply once you have received a response from your provider.  Your safety is important to [place].  If you have drug allergies check your prescription carefully.    You can use MyChart to ask questions about today's visit, request a non-urgent call back, or ask for a work or school excuse for 24 hours related to this e-Visit. If it has been greater than 24 hours you will need to follow up with your provider, or enter a new e-Visit to address those concerns. You will get an e-mail in the next two days asking about your experience.  I hope that your e-visit has been valuable and will speed your recovery. Thank you for using e-visits.

## 2019-04-12 NOTE — Progress Notes (Signed)
Message sent to patient requesting further input regarding current symptoms. Awaiting patient response.  He Is not endorsing symptoms or exposure.

## 2019-04-13 ENCOUNTER — Other Ambulatory Visit: Payer: Self-pay

## 2019-04-13 DIAGNOSIS — Z20822 Contact with and (suspected) exposure to covid-19: Secondary | ICD-10-CM

## 2019-04-15 LAB — NOVEL CORONAVIRUS, NAA: SARS-CoV-2, NAA: NOT DETECTED

## 2019-10-03 ENCOUNTER — Other Ambulatory Visit: Payer: Self-pay

## 2019-10-03 ENCOUNTER — Other Ambulatory Visit: Payer: Self-pay | Admitting: Podiatry

## 2019-10-03 ENCOUNTER — Ambulatory Visit (INDEPENDENT_AMBULATORY_CARE_PROVIDER_SITE_OTHER): Payer: Managed Care, Other (non HMO)

## 2019-10-03 ENCOUNTER — Encounter: Payer: Self-pay | Admitting: Podiatry

## 2019-10-03 ENCOUNTER — Ambulatory Visit (INDEPENDENT_AMBULATORY_CARE_PROVIDER_SITE_OTHER): Payer: Managed Care, Other (non HMO) | Admitting: Podiatry

## 2019-10-03 DIAGNOSIS — M67472 Ganglion, left ankle and foot: Secondary | ICD-10-CM

## 2019-10-03 DIAGNOSIS — M25572 Pain in left ankle and joints of left foot: Secondary | ICD-10-CM

## 2019-10-04 ENCOUNTER — Encounter: Payer: Self-pay | Admitting: Podiatry

## 2019-10-04 ENCOUNTER — Telehealth: Payer: Self-pay | Admitting: *Deleted

## 2019-10-04 DIAGNOSIS — M25572 Pain in left ankle and joints of left foot: Secondary | ICD-10-CM

## 2019-10-04 DIAGNOSIS — M67472 Ganglion, left ankle and foot: Secondary | ICD-10-CM

## 2019-10-04 NOTE — Telephone Encounter (Signed)
-----   Message from Candelaria Stagers, DPM sent at 10/04/2019  1:16 PM EDT ----- Regarding: MRI left ankle Hi Lorenzo Arscott,  Would you be able to order an MRI of the left ankle and I am trying to evaluate a soft tissue mass on the distal tibia.  X-rays were negative.  Thanks Caryn Bee

## 2019-10-04 NOTE — Telephone Encounter (Signed)
Orders to D. Johnson, CMA for pre-cert, faxed to [facility]. 

## 2019-10-04 NOTE — Progress Notes (Signed)
Subjective:  Patient ID: Francis Reyes, male    DOB: Sep 09, 1995,  MRN: [id number]  Chief Complaint  Patient presents with  . Routine Post Op    pt has a possible cyst of the left ankle medial side, pt states that it has been going on for about 2-3 weeks, pt states that it is painful to the touch, pt also states that he also does kickboxing as well.    24 y.o. male presents with the above complaint.  Patient presents with a painful cyst on the left medial ankle.  Patient states he takes box and states that it is painful when he is kickboxing.  Patient states now is going on for 3 weeks has been painful when ambulating.  It is on the medial side of the ankle.  Patient states the pain often varies but does get extubated when doing activities.  Pain scale 3 out of 10.  There is aching sensation to it.  He has not seen anyone else prior to this.  He denies any other alleviating factor.  She denies any other treatment options.   Review of Systems: Negative except as noted in the HPI. Denies N/V/F/Ch.  Past Medical History:  Diagnosis Date  . Kidney stones     Current Outpatient Medications:  .  acetaminophen-codeine 120-12 MG/5ML solution, Take 10 mLs by mouth every 4 (four) hours as needed for moderate pain., Disp: 120 mL, Rfl: 0 .  fluticasone (FLONASE) 50 MCG/ACT nasal spray, Place 2 sprays into both nostrils daily., Disp: 1 g, Rfl: 0 .  Guaifenesin 1200 MG TB12, Take 1 tablet (1,200 mg total) by mouth 2 (two) times daily., Disp: 20 each, Rfl: 0 .  ibuprofen (ADVIL,MOTRIN) 800 MG tablet, Take 1 tablet (800 mg total) by mouth every 8 (eight) hours as needed., Disp: 21 tablet, Rfl: 0 .  oxymetazoline (AFRIN NASAL SPRAY) 0.05 % nasal spray, Place 1 spray into both nostrils 2 (two) times daily., Disp: 30 mL, Rfl: 0 .  predniSONE (DELTASONE) 50 MG tablet, Take 1 tablet (50 mg total) by mouth daily., Disp: 5 tablet, Rfl: 0  Social History   Tobacco Use  Smoking Status Never Smoker    Smokeless Tobacco Never Used    No Known Allergies Objective:  There were no vitals filed for this visit. There is no height or weight on file to calculate BMI. Constitutional Well developed. Well nourished.  Vascular Dorsalis pedis pulses palpable bilaterally. Posterior tibial pulses palpable bilaterally. Capillary refill normal to all digits.  No cyanosis or clubbing noted. Pedal hair growth normal.  Neurologic Normal speech. Oriented to person, place, and time. Epicritic sensation to light touch grossly present bilaterally.  Dermatologic Nails well groomed and normal in appearance. No open wounds. No skin lesions.  Orthopedic:  Pain on palpation to the left anterior ankle medial aspect.  It appears to be distal tibia.  Soft palpable soft tissue mass noted without clear depth.  Appears to be fluctuant in nature.  Mobile.  Not indurated.   Radiographs: 3 views of skeletally mature adult ankle left: No osseous fractures noted.  Mild arthritic changes noted talonavicular joint.  Patient has decreasing calcaneal inclination angle increase in talar declination angle mild elevatus present.  Anterior break in the cyma line.  Ankle joint congruity maintained.  No arthritic changes at the ankle joint. Assessment:   1. Ganglion cyst of left foot   2. Acute left ankle pain    Plan:  Patient was evaluated and treated and  all questions answered.  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  Clinically I am able to palpate a soft tissue mass however I am not clear of the depth of the soft tissue mass.  I believe patient will benefit from MRI evaluation to evaluate the extent of the soft tissue mass.  X-rays were negative for any osseous fracture or any soft tissue edema. -Given the  amount of pain that he is having I also believe he will benefit from cam boot immobilization. -Cam boot was dispensed.  I will reevaluate in 4 weeks.  No follow-ups on file.

## 2019-10-08 ENCOUNTER — Encounter: Payer: Self-pay | Admitting: Podiatry

## 2019-10-11 NOTE — Telephone Encounter (Signed)
Do I need to contact the insurance company for this? I am not sure if I need to do anything.   Let me know   Thanks

## 2019-10-11 NOTE — Telephone Encounter (Signed)
Incoming fax from [facility] requesting clinicals for pre-cert of Case: [id number] was started for the wrong CPT code, MRI left ankle with and without contrast should be [id number].

## 2019-10-11 NOTE — Telephone Encounter (Signed)
[facility] - Francis Reyes states she will ask the nurse how Case: [id number] can be upcoded. Francis Reyes states nurse says to upcode, send clinicals under case number with note stating update to [id number] on the cover sheet. Faxed orders, clinicals and demographics with note on cover sheet stating Case:  [id number] should be updated to [id number].

## 2019-10-11 NOTE — Telephone Encounter (Deleted)
2nd fax of clinical to [facility] for pre-cert Reference # [id number] for MRI

## 2019-10-12 ENCOUNTER — Other Ambulatory Visit: Payer: Self-pay

## 2019-10-12 ENCOUNTER — Telehealth: Payer: Self-pay

## 2019-10-12 ENCOUNTER — Encounter: Payer: Self-pay | Admitting: Podiatry

## 2019-10-12 ENCOUNTER — Ambulatory Visit (INDEPENDENT_AMBULATORY_CARE_PROVIDER_SITE_OTHER): Payer: Managed Care, Other (non HMO) | Admitting: Podiatry

## 2019-10-12 DIAGNOSIS — M67472 Ganglion, left ankle and foot: Secondary | ICD-10-CM

## 2019-10-12 DIAGNOSIS — M25572 Pain in left ankle and joints of left foot: Secondary | ICD-10-CM

## 2019-10-12 DIAGNOSIS — M7752 Other enthesopathy of left foot: Secondary | ICD-10-CM | POA: Diagnosis not present

## 2019-10-16 ENCOUNTER — Encounter: Payer: Self-pay | Admitting: Podiatry

## 2019-10-16 ENCOUNTER — Telehealth: Payer: Self-pay | Admitting: Podiatry

## 2019-10-16 NOTE — Progress Notes (Signed)
Subjective:  Patient ID: Francis Reyes, male    DOB: September 03, 1995,  MRN: [id number]  Chief Complaint  Patient presents with  . Foot Pain    lft foot cyst on top and ankle pain left    24 y.o. male presents with the above complaint.  Patient presents with painful cyst of the left medial ankle.  Patient states that this has been going on for quite some time.  It hurts more at work and after work.  Patient is following up for this cyst as the pain has gotten up.  Patient is scheduled for an MRI on 11/03/2019.  Patient would like to know if a steroid shot would help with this.  He denies any other acute complaints.   Review of Systems: Negative except as noted in the HPI. Denies N/V/F/Ch.  Past Medical History:  Diagnosis Date  . Kidney stones     Current Outpatient Medications:  .  acetaminophen-codeine 120-12 MG/5ML solution, Take 10 mLs by mouth every 4 (four) hours as needed for moderate pain., Disp: 120 mL, Rfl: 0 .  fluticasone (FLONASE) 50 MCG/ACT nasal spray, Place 2 sprays into both nostrils daily., Disp: 1 g, Rfl: 0 .  Guaifenesin 1200 MG TB12, Take 1 tablet (1,200 mg total) by mouth 2 (two) times daily., Disp: 20 each, Rfl: 0 .  ibuprofen (ADVIL,MOTRIN) 800 MG tablet, Take 1 tablet (800 mg total) by mouth every 8 (eight) hours as needed., Disp: 21 tablet, Rfl: 0 .  oxymetazoline (AFRIN NASAL SPRAY) 0.05 % nasal spray, Place 1 spray into both nostrils 2 (two) times daily., Disp: 30 mL, Rfl: 0 .  predniSONE (DELTASONE) 50 MG tablet, Take 1 tablet (50 mg total) by mouth daily., Disp: 5 tablet, Rfl: 0  Social History   Tobacco Use  Smoking Status Never Smoker  Smokeless Tobacco Never Used    No Known Allergies Objective:  There were no vitals filed for this visit. There is no height or weight on file to calculate BMI. Constitutional Well developed. Well nourished.  Vascular Dorsalis pedis pulses palpable bilaterally. Posterior tibial pulses palpable  bilaterally. Capillary refill normal to all digits.  No cyanosis or clubbing noted. Pedal hair growth normal.  Neurologic Normal speech. Oriented to person, place, and time. Epicritic sensation to light touch grossly present bilaterally.  Dermatologic Nails well groomed and normal in appearance. No open wounds. No skin lesions.  Orthopedic:  Pain on palpation to the left anterior ankle medial aspect.  It appears to be distal tibia.  Soft palpable soft tissue mass noted without clear depth.  Appears to be fluctuant in nature.  Mobile.  Not indurated.   Radiographs: 3 views of skeletally mature adult ankle left: No osseous fractures noted.  Mild arthritic changes noted talonavicular joint.  Patient has decreasing calcaneal inclination angle increase in talar declination angle mild elevatus present.  Anterior break in the cyma line.  Ankle joint congruity maintained.  No arthritic changes at the ankle joint. Assessment:   1. Ganglion cyst of left foot   2. Acute left ankle pain   3. Left ankle tendinitis    Plan:  Patient was evaluated and treated and all questions answered.  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  Clinically I am able to palpate  a soft tissue mass however I am not clear of the depth of the soft tissue mass.  Is scheduled for an MRI to be done at the beginning of May.  X-rays were negative for any osseous fracture or any soft tissue edema. -I believe the patient will benefit from a steroid injection to help decrease the inflammatory component of pain.  Patient agrees with the plan would like to proceed with a steroid injection to the left distal leg -A steroid injection was performed at left distal leg using 1% plain Lidocaine and 10 mg  of Kenalog. This was well tolerated. -Given the amount of pain that he is having I also believe he will benefit from cam boot immobilization. -Continue wearing the cam boot.  No follow-ups on file.

## 2019-10-16 NOTE — Telephone Encounter (Signed)
Pt presented to office for the toe shield and requested the restriction letter be resent. I gave pt the toe shield then explained how to use and gave him two copies of the restriction letter.

## 2019-10-16 NOTE — Telephone Encounter (Signed)
I spoke with pt and asked how he would like the restrictions written. Pt states previously he worked 2 hours and had a 10 minute seated break, and would like faxed to [employer] [phone] and in effect for 2 weeks until MRI. 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 Dr. Genice Rouge the note. Faxed to [employer].

## 2019-10-16 NOTE — Telephone Encounter (Signed)
Pt called stating that he may need his restrictions back for his job he was in a lot of pain at work for a full shift also would like to know if there is a closed toe boot he could wear please advise

## 2019-10-17 NOTE — Telephone Encounter (Signed)
-----   Message from Debe Coder sent at 10/17/2019  2:52 PM EDT ----- Regarding: Authorization I bet you are about tired of hearing from me today...  This patient is scheduled for a MRI Ankle w wo; however, the authorization is for wo only.  Please have the authorization updated to match the order.  Thank you  :)

## 2019-10-17 NOTE — Telephone Encounter (Signed)
[employer] - [employer] upcoded to [phone].

## 2019-11-03 ENCOUNTER — Other Ambulatory Visit: Payer: Managed Care, Other (non HMO)

## 2019-11-23 ENCOUNTER — Encounter: Payer: Self-pay | Admitting: Podiatry

## 2019-11-23 ENCOUNTER — Ambulatory Visit (INDEPENDENT_AMBULATORY_CARE_PROVIDER_SITE_OTHER): Payer: Managed Care, Other (non HMO) | Admitting: Podiatry

## 2019-11-23 ENCOUNTER — Other Ambulatory Visit: Payer: Self-pay

## 2019-11-23 VITALS — Temp 97.6°F

## 2019-11-23 DIAGNOSIS — M67472 Ganglion, left ankle and foot: Secondary | ICD-10-CM | POA: Diagnosis not present

## 2019-11-23 DIAGNOSIS — M25572 Pain in left ankle and joints of left foot: Secondary | ICD-10-CM

## 2019-11-23 DIAGNOSIS — M7752 Other enthesopathy of left foot: Secondary | ICD-10-CM | POA: Diagnosis not present

## 2019-11-30 ENCOUNTER — Encounter: Payer: Self-pay | Admitting: Podiatry

## 2019-11-30 NOTE — Progress Notes (Signed)
Subjective:  Patient ID: Francis Reyes, male    DOB: 01-Jan-1996,  MRN: [id number]  Chief Complaint  Patient presents with  . Follow-up    L ganglion cyst. Pt stated, "I tried to get the MRI, but my absence wasn't approved at work. Not much pain, and the swelling comes and goes".    24 y.o. male presents with the above complaint.  Patient presents with a follow-up of painful cyst to the left medial ankle.  Patient states it has gone down considerably and does not have any more pain.  Swelling has resolved itself as well.  Patient been taking ibuprofen as needed.  I will hold off on getting an MRI and patient was not able to get an MRI because he was not able to get a day off of from his work.   Review of Systems: Negative except as noted in the HPI. Denies N/V/F/Ch.  Past Medical History:  Diagnosis Date  . Kidney stones     Current Outpatient Medications:  .  fluticasone (FLONASE) 50 MCG/ACT nasal spray, Place 2 sprays into both nostrils daily., Disp: 1 g, Rfl: 0 .  Guaifenesin 1200 MG TB12, Take 1 tablet (1,200 mg total) by mouth 2 (two) times daily., Disp: 20 each, Rfl: 0 .  ibuprofen (ADVIL,MOTRIN) 800 MG tablet, Take 1 tablet (800 mg total) by mouth every 8 (eight) hours as needed., Disp: 21 tablet, Rfl: 0 .  oxymetazoline (AFRIN NASAL SPRAY) 0.05 % nasal spray, Place 1 spray into both nostrils 2 (two) times daily., Disp: 30 mL, Rfl: 0 .  acetaminophen-codeine 120-12 MG/5ML solution, Take 10 mLs by mouth every 4 (four) hours as needed for moderate pain. (Patient not taking: Reported on 11/23/2019), Disp: 120 mL, Rfl: 0 .  predniSONE (DELTASONE) 50 MG tablet, Take 1 tablet (50 mg total) by mouth daily., Disp: 5 tablet, Rfl: 0  Social History   Tobacco Use  Smoking Status Never Smoker  Smokeless Tobacco Never Used    No Known Allergies Objective:   Vitals:   11/23/19 1533  Temp: 97.6 F (36.4 C)   There is no height or weight on file to calculate BMI. Constitutional  Well developed. Well nourished.  Vascular Dorsalis pedis pulses palpable bilaterally. Posterior tibial pulses palpable bilaterally. Capillary refill normal to all digits.  No cyanosis or clubbing noted. Pedal hair growth normal.  Neurologic Normal speech. Oriented to person, place, and time. Epicritic sensation to light touch grossly present bilaterally.  Dermatologic Nails well groomed and normal in appearance. No open wounds. No skin lesions.  Orthopedic:  Now pain on palpation to the left anterior ankle medial aspect.  It appears to be distal tibia.  Soft palpable soft tissue mass noted without clear depth.  Appears to be fluctuant in nature.  Mobile.  Not indurated.   Radiographs: 3 views of skeletally mature adult ankle left: No osseous fractures noted.  Mild arthritic changes noted talonavicular joint.  Patient has decreasing calcaneal inclination angle increase in talar declination angle mild elevatus present.  Anterior break in the cyma line.  Ankle joint congruity maintained.  No arthritic changes at the ankle joint. Assessment:   1. Acute left ankle pain   2. Ganglion cyst of left foot   3. Left ankle tendinitis    Plan:  Patient was evaluated and treated and all questions answered.  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  The swelling as well as inflammation in the  distal tibia has completely resolved as well.  No pain on palpation given that patient has improved I will hold off on getting an MRI.  Patient can transition from cam boot into regular sneakers now.  If if this recurs we will plan on getting an MRI followed by surgical excision of the mass. -I explained to the patient that this could be temporary or permanent.  Sometimes the cyst can return and there is a 75% recurrence rate.  Patient states understanding.  No follow-ups on file.

## 2021-06-30 ENCOUNTER — Inpatient Hospital Stay (HOSPITAL_COMMUNITY)
Admission: RE | Admit: 2021-06-30 | Discharge: 2021-06-30 | Disposition: A | Discharge status: Home / Self Care | Payer: Self-pay | Source: Ambulatory Visit

## 2021-06-30 ENCOUNTER — Other Ambulatory Visit: Payer: Self-pay

## 2021-06-30 ENCOUNTER — Encounter (HOSPITAL_COMMUNITY): Payer: Self-pay

## 2021-06-30 ENCOUNTER — Ambulatory Visit (HOSPITAL_COMMUNITY)
Admission: EM | Admit: 2021-06-30 | Discharge: 2021-06-30 | Disposition: A | Discharge status: Home / Self Care | Payer: Self-pay | Attending: Student | Admitting: Student

## 2021-06-30 DIAGNOSIS — Z1152 Encounter for screening for COVID-19: Secondary | ICD-10-CM | POA: Insufficient documentation

## 2021-06-30 DIAGNOSIS — U071 COVID-19: Secondary | ICD-10-CM | POA: Insufficient documentation

## 2021-06-30 DIAGNOSIS — J069 Acute upper respiratory infection, unspecified: Secondary | ICD-10-CM | POA: Insufficient documentation

## 2021-06-30 LAB — POC INFLUENZA A AND B ANTIGEN (URGENT CARE ONLY)
INFLUENZA A ANTIGEN, POC: NEGATIVE
INFLUENZA B ANTIGEN, POC: NEGATIVE

## 2021-06-30 MED ORDER — PROMETHAZINE-DM 6.25-15 MG/5ML PO SYRP: 5.0000 mL | ORAL_SOLUTION | Freq: Four times a day (QID) | ORAL | 0 refills | Status: DC | PRN

## 2021-06-30 MED ORDER — BENZONATATE 100 MG PO CAPS: 100.0000 mg | ORAL_CAPSULE | Freq: Three times a day (TID) | ORAL | 0 refills | Status: DC

## 2021-06-30 NOTE — Discharge Instructions (Addendum)
-  Promethazine DM cough syrup for congestion/cough. This could make you drowsy, so take at night before bed. -Tessalon (Benzonatate) as needed for cough. Take one pill up to 3x daily (every 8 hours) -You can take Tylenol up to 1000 mg 3 times daily, and ibuprofen up to 600 mg 3 times daily with food.  You can take these together, or alternate every 3-4 hours. -With a virus, you're typically contagious for 5-7 days, or as long as you're having fevers.  -Drink plenty of fluids

## 2021-06-30 NOTE — ED Provider Notes (Signed)
[facility]    CSN: [id number] Arrival date & time: 06/30/21  0950      History   Chief Complaint Chief Complaint  Patient presents with   Cough    Appt 10   Generalized Body Aches    HPI Francis Reyes is a 26 y.o. male presenting with viral syndrome x3 days. Medical history noncontributory,denies asthma, nonsmoker. Describes 3 days of myalgias, cough productive of yellow and green mucous that's sticky and hard to cough up. Subjective chills, has not monitored temperature at home. Denies known sick exposures. Denies SOB, CP, dizziness, weakness. Tolerating fluids and foods. Has attempted dayquil with minimal relief. Needs work note.   HPI  Past Medical History:  Diagnosis Date   Kidney stones     There are no problems to display for this patient.   History reviewed. No pertinent surgical history.     Home Medications    Prior to Admission medications   Medication Sig Start Date End Date Taking? Authorizing Provider  benzonatate (TESSALON) 100 MG capsule Take 1 capsule (100 mg total) by mouth every 8 (eight) hours. 06/30/21  Yes Rhys MartiniGraham, Seichi Kaufhold E, PA-C  promethazine-dextromethorphan (PROMETHAZINE-DM) 6.25-15 MG/5ML syrup Take 5 mLs by mouth 4 (four) times daily as needed for cough. 06/30/21  Yes Rhys MartiniGraham, Leen Tworek E, PA-C  acetaminophen-codeine 120-12 MG/5ML solution Take 10 mLs by mouth every 4 (four) hours as needed for moderate pain. Patient not taking: Reported on 11/23/2019 09/13/18   Charlestine NightLawyer, Christopher, PA-C  fluticasone [facility](FLONASE) 50 MCG/ACT nasal spray Place 2 sprays into both nostrils daily. 08/18/18   Cathie HoopsYu, Amy V, PA-C  Guaifenesin 1200 MG TB12 Take 1 tablet (1,200 mg total) by mouth 2 (two) times daily. 09/13/18   Lawyer, Cristal Deerhristopher, PA-C  ibuprofen (ADVIL,MOTRIN) 800 MG tablet Take 1 tablet (800 mg total) by mouth every 8 (eight) hours as needed. 09/13/18   Lawyer, Cristal Deerhristopher, PA-C  oxymetazoline (AFRIN NASAL SPRAY) 0.05 % nasal spray Place 1 spray into both  nostrils 2 (two) times daily. 08/18/18   Cathie HoopsYu, Amy V, PA-C  predniSONE (DELTASONE) 50 MG tablet Take 1 tablet (50 mg total) by mouth daily. 08/18/18   Belinda FisherYu, Amy V, PA-C    Family History History reviewed. No pertinent family history.  Social History Social History   Tobacco Use   Smoking status: Never   Smokeless tobacco: Never  Substance Use Topics   Alcohol use: No   Drug use: No     Allergies   Patient has no known allergies.   Review of Systems Review of Systems  Constitutional:  Negative for appetite change, chills and fever.  HENT:  Positive for congestion. Negative for ear pain, rhinorrhea, sinus pressure, sinus pain and sore throat.   Eyes:  Negative for redness and visual disturbance.  Respiratory:  Positive for cough. Negative for chest tightness, shortness of breath and wheezing.   Cardiovascular:  Negative for chest pain and palpitations.  Gastrointestinal:  Negative for abdominal pain, constipation, diarrhea, nausea and vomiting.  Genitourinary:  Negative for dysuria, frequency and urgency.  Musculoskeletal:  Positive for myalgias.  Neurological:  Negative for dizziness, weakness and headaches.  Psychiatric/Behavioral:  Negative for confusion.   All other systems reviewed and are negative.   Physical Exam Triage Vital Signs ED Triage Vitals  Enc Vitals Group     BP 06/30/21 1027 132/84     Pulse Rate 06/30/21 1025 87     Resp 06/30/21 1025 18     Temp 06/30/21 1025  98.3 F (36.8 C)     Temp Source 06/30/21 1025 Oral     SpO2 06/30/21 1025 98 %     Weight --      Height --      Head Circumference --      Peak Flow --      Pain Score 06/30/21 1024 5     Pain Loc --      Pain Edu? --      Excl. in [place]? --    No data found.  Updated Vital Signs BP 132/84    Pulse 87    Temp 98.3 F (36.8 C) (Oral)    Resp 18    SpO2 98%   Visual Acuity Right Eye Distance:   Left Eye Distance:   Bilateral Distance:    Right Eye Near:   Left Eye Near:     Bilateral Near:     Physical Exam Vitals reviewed.  Constitutional:      General: He is not in acute distress.    Appearance: Normal appearance. He is not ill-appearing.  HENT:     Head: Normocephalic and atraumatic.     Right Ear: Tympanic membrane, ear canal and external ear normal. No tenderness. No middle ear effusion. There is no impacted cerumen. Tympanic membrane is not perforated, erythematous, retracted or bulging.     Left Ear: Tympanic membrane, ear canal and external ear normal. No tenderness.  No middle ear effusion. There is no impacted cerumen. Tympanic membrane is not perforated, erythematous, retracted or bulging.     Nose: Nose normal. No congestion.     Mouth/Throat:     Mouth: Mucous membranes are moist.     Pharynx: Uvula midline. No oropharyngeal exudate or posterior oropharyngeal erythema.  Eyes:     Extraocular Movements: Extraocular movements intact.     Pupils: Pupils are equal, round, and reactive to light.  Cardiovascular:     Rate and Rhythm: Normal rate and regular rhythm.     Heart sounds: Normal heart sounds.  Pulmonary:     Effort: Pulmonary effort is normal.     Breath sounds: Normal breath sounds. No decreased breath sounds, wheezing, rhonchi or rales.     Comments: Freq cough  Abdominal:     Palpations: Abdomen is soft.     Tenderness: There is no abdominal tenderness. There is no guarding or rebound.  Lymphadenopathy:     Cervical: No cervical adenopathy.     Right cervical: No superficial cervical adenopathy.    Left cervical: No superficial cervical adenopathy.  Neurological:     General: No focal deficit present.     Mental Status: He is alert and oriented to person, place, and time.  Psychiatric:        Mood and Affect: Mood normal.        Behavior: Behavior normal.        Thought Content: Thought content normal.        Judgment: Judgment normal.     UC Treatments / Results  Labs (all labs ordered are listed, but only abnormal  results are displayed) Labs Reviewed  SARS CORONAVIRUS 2 (TAT 6-24 HRS)  POC INFLUENZA A AND B ANTIGEN (URGENT CARE ONLY)    EKG   Radiology No results found.  Procedures Procedures (including critical care time)  Medications Ordered in UC Medications - No data to display  Initial Impression / Assessment and Plan / UC Course  I have reviewed the triage vital signs and  the nursing notes.  Pertinent labs & imaging results that were available during my care of the patient were reviewed by me and considered in my medical decision making (see chart for details).     This patient is a very pleasant 26 y.o. year old male presenting with viral URI. Today this pt is afebrile nontachycardic nontachypneic, oxygenating well on room air, no wheezes rhonchi or rales.   Rapid influenza negative. Covid PCR sent.  Promethazine DM, tessalon sent. Work note provided.   ED return precautions discussed. Patient verbalizes understanding and agreement.     Final Clinical Impressions(s) / UC Diagnoses   Final diagnoses:  Viral URI with cough  Encounter for screening for COVID-19     Discharge Instructions      -Promethazine DM cough syrup for congestion/cough. This could make you drowsy, so take at night before bed. -Tessalon (Benzonatate) as needed for cough. Take one pill up to 3x daily (every 8 hours) -You can take Tylenol up to 1000 mg 3 times daily, and ibuprofen up to 600 mg 3 times daily with food.  You can take these together, or alternate every 3-4 hours. -With a virus, you're typically contagious for 5-7 days, or as long as you're having fevers.  -Drink plenty of fluids     ED Prescriptions     Medication Sig Dispense Auth. Provider   promethazine-dextromethorphan (PROMETHAZINE-DM) 6.25-15 MG/5ML syrup Take 5 mLs by mouth 4 (four) times daily as needed for cough. 118 mL Hazel Sams, PA-C   benzonatate (TESSALON) 100 MG capsule Take 1 capsule (100 mg total) by mouth  every 8 (eight) hours. 21 capsule Hazel Sams, PA-C      PDMP not reviewed this encounter.   Hazel Sams, PA-C 06/30/21 1151

## 2021-06-30 NOTE — ED Triage Notes (Signed)
Pt presents with loss of appetite x 3 days.   Presents with productive cough. States he coughs up thick mucus. C/o feeling his throat congested with mucus and c/o body aches.   States the body aches have been worsening x 3 days. 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

## 2021-07-01 LAB — SARS CORONAVIRUS 2 (TAT 6-24 HRS): SARS Coronavirus 2: POSITIVE — AB

## 2022-08-25 ENCOUNTER — Ambulatory Visit (HOSPITAL_COMMUNITY)
Admission: RE | Admit: 2022-08-25 | Discharge: 2022-08-25 | Disposition: A | Discharge status: Home / Self Care | Payer: Self-pay | Source: Ambulatory Visit | Attending: Family Medicine | Admitting: Family Medicine

## 2022-08-25 ENCOUNTER — Encounter (HOSPITAL_COMMUNITY): Payer: Self-pay

## 2022-08-25 ENCOUNTER — Other Ambulatory Visit: Payer: Self-pay

## 2022-08-25 ENCOUNTER — Ambulatory Visit (INDEPENDENT_AMBULATORY_CARE_PROVIDER_SITE_OTHER): Payer: Self-pay

## 2022-08-25 VITALS — BP 131/84 | HR 97 | Temp 98.1°F | Resp 20

## 2022-08-25 DIAGNOSIS — J189 Pneumonia, unspecified organism: Secondary | ICD-10-CM | POA: Insufficient documentation

## 2022-08-25 DIAGNOSIS — J069 Acute upper respiratory infection, unspecified: Secondary | ICD-10-CM | POA: Insufficient documentation

## 2022-08-25 DIAGNOSIS — R059 Cough, unspecified: Secondary | ICD-10-CM

## 2022-08-25 DIAGNOSIS — Z1152 Encounter for screening for COVID-19: Secondary | ICD-10-CM | POA: Insufficient documentation

## 2022-08-25 MED ORDER — DOXYCYCLINE HYCLATE 100 MG PO CAPS: 100.0000 mg | ORAL_CAPSULE | Freq: Two times a day (BID) | ORAL | 0 refills | Status: AC

## 2022-08-25 MED ORDER — BENZONATATE 100 MG PO CAPS: 100.0000 mg | ORAL_CAPSULE | Freq: Three times a day (TID) | ORAL | 0 refills | Status: DC | PRN

## 2022-08-25 NOTE — Discharge Instructions (Signed)
Your chest x-ray showed something called atelectasis--this often means that small airspaces are not fully expanding.  Since this could mean that you have an early pneumonia developing, I have sent in doxycycline  Take doxycycline 100 mg --1 capsule 2 times daily for 7 days  Take benzonatate 100 mg, 1 tab every 8 hours as needed for cough.   You have been swabbed for COVID, and the test will result in the next 24 hours. Our staff will call you if positive. If the COVID test is positive, you should quarantine for 5 days from the start of your symptoms.  On days 6-10 from the start of your illness, you should wear a mask if out in public.

## 2022-08-25 NOTE — ED Provider Notes (Signed)
[place]    CSN: [id number] Arrival date & time: 08/25/22  1347      History   Chief Complaint Chief Complaint  Patient presents with   Cough   Appt 1400    HPI Francis Reyes is a 27 y.o. male.    Cough  Here for cough and congestion and sore throat.  He began coughing on February 24 in the evening.  He then developed fever the next day, though he is not gotten to check it with a thermometer.  He has not had any fever since yesterday.  On February 26 he developed some nasal congestion and more sore throat.  He has been bringing up a good bit of white and yellow mucus, and has felt short chest pains with coughing.  He has vomited some with some hard coughing.  No diarrhea  Past medical history is negative for asthma.    Past Medical History:  Diagnosis Date   Kidney stones     There are no problems to display for this patient.   History reviewed. No pertinent surgical history.     Home Medications    Prior to Admission medications   Medication Sig Start Date End Date Taking? Authorizing Provider  benzonatate (TESSALON) 100 MG capsule Take 1 capsule (100 mg total) by mouth 3 (three) times daily as needed for cough. 08/25/22  Yes Barrett Henle, MD  doxycycline (VIBRAMYCIN) 100 MG capsule Take 1 capsule (100 mg total) by mouth 2 (two) times daily for 7 days. 08/25/22 09/01/22 Yes Barrett Henle, MD  fluticasone (FLONASE) 50 MCG/ACT nasal spray Place 2 sprays into both nostrils daily. 08/18/18   Ok Edwards, PA-C    Family History History reviewed. No pertinent family history.  Social History Social History   Tobacco Use   Smoking status: Never   Smokeless tobacco: Never  Vaping Use   Vaping Use: Never used  Substance Use Topics   Alcohol use: Yes    Comment: occasionally   Drug use: No     Allergies   Patient has no known allergies.   Review of Systems Review of Systems  Respiratory:  Positive for cough.      Physical  Exam Triage Vital Signs ED Triage Vitals [08/25/22 1404]  Enc Vitals Group     BP 131/84     Pulse Rate 97     Resp 20     Temp 98.1 F (36.7 C)     Temp Source Oral     SpO2 94 %     Weight      Height      Head Circumference      Peak Flow      Pain Score 0     Pain Loc      Pain Edu?      Excl. in [place]?    No data found.  Updated Vital Signs BP 131/84   Pulse 97   Temp 98.1 F (36.7 C) (Oral)   Resp 20   SpO2 94%   Visual Acuity Right Eye Distance:   Left Eye Distance:   Bilateral Distance:    Right Eye Near:   Left Eye Near:    Bilateral Near:     Physical Exam Vitals reviewed.  Constitutional:      General: He is not in acute distress.    Appearance: He is not toxic-appearing.  HENT:     Right Ear: Tympanic membrane and ear canal normal.  Left Ear: Tympanic membrane and ear canal normal.     Nose: Nose normal.     Mouth/Throat:     Mouth: Mucous membranes are moist.     Comments: There is very mild erythema of the oropharynx with white and clear mucus draining. Eyes:     Extraocular Movements: Extraocular movements intact.     Conjunctiva/sclera: Conjunctivae normal.     Pupils: Pupils are equal, round, and reactive to light.  Cardiovascular:     Rate and Rhythm: Normal rate and regular rhythm.     Heart sounds: No murmur heard. Pulmonary:     Effort: No respiratory distress.     Breath sounds: No stridor. No wheezing, rhonchi or rales.  Musculoskeletal:     Cervical back: Neck supple.  Lymphadenopathy:     Cervical: No cervical adenopathy.  Skin:    Capillary Refill: Capillary refill takes less than 2 seconds.     Coloration: Skin is not jaundiced or pale.  Neurological:     General: No focal deficit present.     Mental Status: He is alert and oriented to person, place, and time.  Psychiatric:        Behavior: Behavior normal.      UC Treatments / Results  Labs (all labs ordered are listed, but only abnormal results are  displayed) Labs Reviewed  SARS CORONAVIRUS 2 (TAT 6-24 HRS)    EKG   Radiology DG Chest 2 View  Result Date: 08/25/2022 CLINICAL DATA:  Cough EXAM: CHEST - 2 VIEW COMPARISON:  CXR 12/31/07 FINDINGS: No pleural effusion. No pneumothorax. No focal airspace opacity. Low lung volumes. Normal cardiac and mediastinal contours. Bibasilar linear nonspecific airspace opacities are favored to represent atelectasis. IMPRESSION: Low lung volumes with bibasilar linear nonspecific airspace opacities, favored to represent atelectasis. Electronically Signed   By: Marin Roberts M.D.   On: 08/25/2022 15:20    Procedures Procedures (including critical care time)  Medications Ordered in UC Medications - No data to display  Initial Impression / Assessment and Plan / UC Course  I have reviewed the triage vital signs and the nursing notes.  Pertinent labs & imaging results that were available during my care of the patient were reviewed by me and considered in my medical decision making (see chart for details).        Chest x-ray shows some possible atelectasis in the lower lobes.  I am going to treat for possible early pneumonia with that interpretation.  He is swabbed for COVID.  Prescription is sent for cough with Tessalon Perles.  He is swabbed for COVID.  If positive he is a candidate for Paxlovid; his last EGFR was greater than 60 Final Clinical Impressions(s) / UC Diagnoses   Final diagnoses:  Viral URI with cough  Community acquired pneumonia, unspecified laterality     Discharge Instructions      Your chest x-ray showed something called atelectasis--this often means that small airspaces are not fully expanding.  Since this could mean that you have an early pneumonia developing, I have sent in doxycycline  Take doxycycline 100 mg --1 capsule 2 times daily for 7 days  Take benzonatate 100 mg, 1 tab every 8 hours as needed for cough.   You have been swabbed for COVID, and the test will  result in the next 24 hours. Our staff will call you if positive. If the COVID test is positive, you should quarantine for 5 days from the start of your symptoms.  On  days 6-10 from the start of your illness, you should wear a mask if out in public.       ED Prescriptions     Medication Sig Dispense Auth. Provider   doxycycline (VIBRAMYCIN) 100 MG capsule Take 1 capsule (100 mg total) by mouth 2 (two) times daily for 7 days. 14 capsule Iseah Plouff, Gwenlyn Perking, MD   benzonatate (TESSALON) 100 MG capsule Take 1 capsule (100 mg total) by mouth 3 (three) times daily as needed for cough. 21 capsule Barrett Henle, MD      PDMP not reviewed this encounter.   Barrett Henle, MD 08/25/22 [phone]

## 2022-08-25 NOTE — ED Triage Notes (Addendum)
C/O started with progressively worsening cough onset 4 days ago; states cough is productive with thick sputum. C/O chest pain with coughing. No known fevers, but c/o mild tactile fevers. Has been taking Tyl and Mucinex.

## 2022-08-26 LAB — SARS CORONAVIRUS 2 (TAT 6-24 HRS): SARS Coronavirus 2: NEGATIVE

## 2022-09-23 ENCOUNTER — Emergency Department (HOSPITAL_COMMUNITY): Payer: Self-pay

## 2022-09-23 ENCOUNTER — Emergency Department (HOSPITAL_COMMUNITY)
Admission: EM | Admit: 2022-09-23 | Discharge: 2022-09-23 | Disposition: A | Discharge status: Home / Self Care | Payer: Self-pay | Attending: Emergency Medicine | Admitting: Emergency Medicine

## 2022-09-23 ENCOUNTER — Other Ambulatory Visit: Payer: Self-pay

## 2022-09-23 ENCOUNTER — Encounter (HOSPITAL_COMMUNITY): Payer: Self-pay

## 2022-09-23 DIAGNOSIS — F19920 Other psychoactive substance use, unspecified with intoxication, uncomplicated: Secondary | ICD-10-CM

## 2022-09-23 DIAGNOSIS — Y92410 Unspecified street and highway as the place of occurrence of the external cause: Secondary | ICD-10-CM | POA: Insufficient documentation

## 2022-09-23 DIAGNOSIS — S80211A Abrasion, right knee, initial encounter: Secondary | ICD-10-CM | POA: Insufficient documentation

## 2022-09-23 DIAGNOSIS — Z23 Encounter for immunization: Secondary | ICD-10-CM | POA: Insufficient documentation

## 2022-09-23 DIAGNOSIS — R4182 Altered mental status, unspecified: Secondary | ICD-10-CM | POA: Insufficient documentation

## 2022-09-23 DIAGNOSIS — X58XXXA Exposure to other specified factors, initial encounter: Secondary | ICD-10-CM | POA: Insufficient documentation

## 2022-09-23 LAB — CBC WITH DIFFERENTIAL/PLATELET
Abs Immature Granulocytes: 0.09 10*3/uL — ABNORMAL HIGH (ref 0.00–0.07)
Basophils Absolute: 0.1 10*3/uL (ref 0.0–0.1)
Basophils Relative: 0 %
Eosinophils Absolute: 0.1 10*3/uL (ref 0.0–0.5)
Eosinophils Relative: 0 %
HCT: 44 % (ref 39.0–52.0)
Hemoglobin: 15.5 g/dL (ref 13.0–17.0)
Immature Granulocytes: 1 %
Lymphocytes Relative: 15 %
Lymphs Abs: 1.9 10*3/uL (ref 0.7–4.0)
MCH: 28.2 pg (ref 26.0–34.0)
MCHC: 35.2 g/dL (ref 30.0–36.0)
MCV: 80.1 fL (ref 80.0–100.0)
Monocytes Absolute: 0.8 10*3/uL (ref 0.1–1.0)
Monocytes Relative: 7 %
Neutro Abs: 9.7 10*3/uL — ABNORMAL HIGH (ref 1.7–7.7)
Neutrophils Relative %: 77 %
Platelets: 288 10*3/uL (ref 150–400)
RBC: 5.49 MIL/uL (ref 4.22–5.81)
RDW: 13.1 % (ref 11.5–15.5)
WBC: 12.7 10*3/uL — ABNORMAL HIGH (ref 4.0–10.5)
nRBC: 0 % (ref 0.0–0.2)

## 2022-09-23 LAB — COMPREHENSIVE METABOLIC PANEL
ALT: 38 U/L (ref 0–44)
AST: 36 U/L (ref 15–41)
Albumin: 4 g/dL (ref 3.5–5.0)
Alkaline Phosphatase: 42 U/L (ref 38–126)
Anion gap: 10 (ref 5–15)
BUN: 10 mg/dL (ref 6–20)
CO2: 28 mmol/L (ref 22–32)
Calcium: 9.6 mg/dL (ref 8.9–10.3)
Chloride: 97 mmol/L — ABNORMAL LOW (ref 98–111)
Creatinine, Ser: 1.22 mg/dL (ref 0.61–1.24)
GFR, Estimated: >60 mL/min (ref >60)
Glucose, Bld: 123 mg/dL — ABNORMAL HIGH (ref 70–99)
Potassium: 3.9 mmol/L (ref 3.5–5.1)
Sodium: 135 mmol/L (ref 135–145)
Total Bilirubin: 0.9 mg/dL (ref 0.3–1.2)
Total Protein: 7.5 g/dL (ref 6.5–8.1)

## 2022-09-23 MED ORDER — LACTATED RINGERS IV BOLUS
1000.0000 mL | Freq: Once | INTRAVENOUS | Status: AC
Start: 2022-09-23 — End: 2022-09-23
  Administered 2022-09-23: 1000 mL via INTRAVENOUS

## 2022-09-23 MED ORDER — LACTATED RINGERS IV BOLUS
1000.0000 mL | Freq: Once | INTRAVENOUS | Status: AC
  Administered 2022-09-23: 1000 mL via INTRAVENOUS

## 2022-09-23 MED ORDER — ONDANSETRON HCL 4 MG/2ML IJ SOLN
4.0000 mg | Freq: Once | INTRAMUSCULAR | Status: AC
  Administered 2022-09-23: 4 mg via INTRAVENOUS
  Filled 2022-09-23: qty 2

## 2022-09-23 MED ORDER — LORAZEPAM 2 MG/ML IJ SOLN
2.0000 mg | Freq: Once | INTRAMUSCULAR | Status: AC
  Administered 2022-09-23: 2 mg via INTRAVENOUS
  Filled 2022-09-23: qty 1

## 2022-09-23 MED ORDER — TETANUS-DIPHTH-ACELL PERTUSSIS 5-2.5-18.5 LF-MCG/0.5 IM SUSY
0.5000 mL | PREFILLED_SYRINGE | Freq: Once | INTRAMUSCULAR | Status: AC
Start: 2022-09-23 — End: 2022-09-23
  Administered 2022-09-23: 0.5 mL via INTRAMUSCULAR
  Filled 2022-09-23: qty 0.5

## 2022-09-23 NOTE — ED Provider Notes (Signed)
[place] Provider Note   CSN: [id number] Arrival date & time: 09/23/22  0020     History  Chief Complaint  Patient presents with   Altered Mental Status    TOMOTHY OLIVOS is a 27 y.o. male.  27 year old male that presents here with altered mental status.  Patient was apparently found in the street by a friend who knows that he took some type of edible.  Patient knows his name but is not able to offer any other history.  Family on the way.   Altered Mental Status      Home Medications Prior to Admission medications   Medication Sig Start Date End Date Taking? Authorizing Provider  benzonatate (TESSALON) 100 MG capsule Take 1 capsule (100 mg total) by mouth 3 (three) times daily as needed for cough. 08/25/22   Barrett Henle, MD  fluticasone (FLONASE) 50 MCG/ACT nasal spray Place 2 sprays into both nostrils daily. 08/18/18   Ok Edwards, PA-C      Allergies    Patient has no known allergies.    Review of Systems   Review of Systems  Physical Exam Updated Vital Signs BP 108/79 (BP Location: Left Arm)   Pulse (!) 121   Temp 98.8 F (37.1 C) (Axillary)   Resp 18   Ht 6' (1.829 m)   SpO2 98%   BMI 36.62 kg/m  Physical Exam Vitals and nursing note reviewed.  Constitutional:      Appearance: He is well-developed.  HENT:     Head: Normocephalic and atraumatic.  Cardiovascular:     Rate and Rhythm: Normal rate.  Pulmonary:     Effort: Pulmonary effort is normal. No respiratory distress.  Abdominal:     General: There is no distension.  Musculoskeletal:        General: Normal range of motion.     Cervical back: Normal range of motion.  Skin:    Comments: Abrasion to right knee  Neurological:     Mental Status: He is alert.  Psychiatric:        Attention and Perception: He is inattentive.        Speech: He is noncommunicative.        Cognition and Memory: Cognition is impaired.     ED Results /  Procedures / Treatments   Labs (all labs ordered are listed, but only abnormal results are displayed) Labs Reviewed  CBC WITH DIFFERENTIAL/PLATELET  COMPREHENSIVE METABOLIC PANEL  RAPID URINE DRUG SCREEN, HOSP PERFORMED    EKG None  Radiology No results found.  Procedures Procedures    Medications Ordered in ED Medications  ondansetron (ZOFRAN) injection 4 mg (has no administration in time range)  lactated ringers bolus 1,000 mL (has no administration in time range)  LORazepam (ATIVAN) injection 2 mg (has no administration in time range)    ED Course/ Medical Decision Making/ A&P Clinical Course as of 09/23/22 2257  Thu Sep 23, 2022  0712 Received sign out from Dr. Dayna Barker pending re-assessment. Told friend he took an edible. Was confused here. CT head negative.  [WS]  [id number] Patient more awake.  Reports he took a "200 mg" marijuana gummy.  He reports that he was walking on the street because he was kicked out of friend's home because he was too intoxicated. Denies alcohol. Mother at bedside. Will attempt PO trial and ambulation [WS]  [id number] Patient able to tolerate p.o., ambulatory.  Family at bedside. Will discharge patient  to home. All questions answered. Patient comfortable with plan of discharge. Return precautions discussed with patient and specified on the after visit summary. [WS]    Clinical Course User Index [WS] Cristie Hem, MD                             Medical Decision Making Amount and/or Complexity of Data Reviewed Labs: ordered. Radiology: ordered. ECG/medicine tests: ordered.  Risk Prescription drug management.  Will eval for head injury, ativan/IVF.  CT head done and showed no obvious bleed or other abnormalities (independently viewed and interpreted by myself and radiology read reviewed). Wakes to voice, improving vitals with fluids, will allow to continue to metabolize.   Care transferred pending reevaluation for sobriety.    Final  Clinical Impression(s) / ED Diagnoses Final diagnoses:  None    Rx / DC Orders ED Discharge Orders     None         Mea Ozga, Corene Cornea, MD 09/23/22 2258

## 2022-09-23 NOTE — ED Notes (Signed)
Patient is resting comfortably. 

## 2022-09-23 NOTE — ED Notes (Signed)
MOM WANTS TO BE CALLED TO PICK HIM UP

## 2022-09-23 NOTE — ED Provider Notes (Signed)
  Physical Exam  BP 106/81   Pulse 88   Temp 98.3 F (36.8 C) (Oral)   Resp 10   Ht 6' (1.829 m)   SpO2 97%   BMI 36.62 kg/m   Physical Exam Vitals and nursing note reviewed.  Constitutional:      General: He is not in acute distress.    Appearance: Normal appearance.     Comments: Sleepy   HENT:     Mouth/Throat:     Mouth: Mucous membranes are moist.  Eyes:     Conjunctiva/sclera: Conjunctivae normal.  Cardiovascular:     Rate and Rhythm: Normal rate and regular rhythm.  Pulmonary:     Effort: Pulmonary effort is normal. No respiratory distress.     Breath sounds: Normal breath sounds.  Abdominal:     General: Abdomen is flat.     Palpations: Abdomen is soft.     Tenderness: There is no abdominal tenderness.  Musculoskeletal:     Right lower leg: No edema.     Left lower leg: No edema.  Skin:    General: Skin is warm and dry.     Capillary Refill: Capillary refill takes less than 2 seconds.  Neurological:     Mental Status: He is alert and oriented to person, place, and time. Mental status is at baseline.  Psychiatric:        Mood and Affect: Mood normal.        Behavior: Behavior normal.     Procedures  Procedures  ED Course / MDM   Clinical Course as of 09/23/22 1147  Thu Sep 23, 2022  0712 Received sign out from Dr. Dayna Barker pending re-assessment. Told friend he took an edible. Was confused here. CT head negative.  [WS]  [id number] Patient more awake.  Reports he took a "200 mg" marijuana gummy.  He reports that he was walking on the street because he was kicked out of friend's home because he was too intoxicated. Denies alcohol. Mother at bedside. Will attempt PO trial and ambulation [WS]  [id number] Patient able to tolerate p.o., ambulatory.  Family at bedside. Will discharge patient to home. All questions answered. Patient comfortable with plan of discharge. Return precautions discussed with patient and specified on the after visit summary. [WS]    Clinical Course  User Index [WS] Cristie Hem, MD   Medical Decision Making Amount and/or Complexity of Data Reviewed Labs: ordered. Radiology: ordered. ECG/medicine tests: ordered.  Risk Prescription drug management.        Cristie Hem, MD 09/23/22 1147

## 2022-09-23 NOTE — Discharge Instructions (Addendum)
We evaluated you for your confusion.  Your laboratory testing was reassuring and a CT scan of your head did not show any dangerous process.  Your symptoms are probably due to the marijuana edible you took.  Please do not take this in the future as it could cause confusion.  Please follow-up with your primary doctor.  Return to the emergency department if you develop any increased confusion, uncontrolled vomiting, chest pain, abdominal pain, or any other new symptoms.

## 2022-09-23 NOTE — ED Triage Notes (Signed)
Per another nurse, patient was brought to the emergency department by a friend who found patient in the street on the ground. Pt's friend said he knows Lefty took an edible.  Pt is verbal but altered. Has emesis around his nose and in his beard. Cold to touch. Has an abrasion on his right knee.

## 2022-12-08 ENCOUNTER — Emergency Department (HOSPITAL_COMMUNITY): Payer: Self-pay

## 2022-12-08 ENCOUNTER — Encounter (HOSPITAL_COMMUNITY): Payer: Self-pay

## 2022-12-08 ENCOUNTER — Emergency Department (HOSPITAL_COMMUNITY)
Admission: EM | Admit: 2022-12-08 | Discharge: 2022-12-08 | Disposition: A | Discharge status: Home / Self Care | Payer: Self-pay | Attending: Emergency Medicine | Admitting: Emergency Medicine

## 2022-12-08 ENCOUNTER — Other Ambulatory Visit: Payer: Self-pay

## 2022-12-08 DIAGNOSIS — N200 Calculus of kidney: Secondary | ICD-10-CM | POA: Insufficient documentation

## 2022-12-08 LAB — URINALYSIS, ROUTINE W REFLEX MICROSCOPIC
Bilirubin Urine: NEGATIVE
Glucose, UA: NEGATIVE mg/dL
Ketones, ur: NEGATIVE mg/dL
Leukocytes,Ua: NEGATIVE
Nitrite: NEGATIVE
Protein, ur: NEGATIVE mg/dL
RBC / HPF: >50 RBC/hpf (ref 0–5)
Specific Gravity, Urine: 1.014 (ref 1.005–1.030)
pH: 8 (ref 5.0–8.0)

## 2022-12-08 MED ORDER — KETOROLAC TROMETHAMINE 15 MG/ML IJ SOLN
15.0000 mg | Freq: Once | INTRAMUSCULAR | Status: AC
  Administered 2022-12-08: 15 mg via INTRAVENOUS
  Filled 2022-12-08: qty 1

## 2022-12-08 MED ORDER — ONDANSETRON 4 MG PO TBDP: ORAL_TABLET | ORAL | 0 refills | Status: DC

## 2022-12-08 MED ORDER — ONDANSETRON HCL 4 MG/2ML IJ SOLN
4.0000 mg | Freq: Once | INTRAMUSCULAR | Status: AC
  Administered 2022-12-08: 4 mg via INTRAVENOUS
  Filled 2022-12-08: qty 2

## 2022-12-08 MED ORDER — MORPHINE SULFATE 15 MG PO TABS: 7.5000 mg | ORAL_TABLET | ORAL | 0 refills | Status: DC | PRN

## 2022-12-08 MED ORDER — SODIUM CHLORIDE 0.9 % IV BOLUS
1000.0000 mL | Freq: Once | INTRAVENOUS | Status: AC
  Administered 2022-12-08: 1000 mL via INTRAVENOUS

## 2022-12-08 MED ORDER — TAMSULOSIN HCL 0.4 MG PO CAPS: 0.4000 mg | ORAL_CAPSULE | Freq: Every day | ORAL | 0 refills | Status: DC

## 2022-12-08 MED ORDER — MORPHINE SULFATE (PF) 4 MG/ML IV SOLN
4.0000 mg | Freq: Once | INTRAVENOUS | Status: AC
  Administered 2022-12-08: 4 mg via INTRAVENOUS
  Filled 2022-12-08: qty 1

## 2022-12-08 NOTE — Discharge Instructions (Addendum)
Call urology office to set up an appointment.  Please return for fever or uncontrolled pain.  Take 4 over the counter ibuprofen tablets 3 times a day or 2 over-the-counter naproxen tablets twice a day for pain. Also take tylenol 1000mg (2 extra strength) four times a day.   Then take the pain medicine if you feel like you need it. Narcotics do not help with the pain, they only make you care about it less.  You can become addicted to this, people may break into your house to steal it.  It will constipate you.  If you drive under the influence of this medicine you can get a DUI.

## 2022-12-08 NOTE — ED Provider Notes (Signed)
[place] EMERGENCY DEPARTMENT AT [facility] Provider Note   CSN: [id number] Arrival date & time: 12/08/22  4098     History  Chief Complaint  Patient presents with   Flank Pain         Francis Reyes is a 27 y.o. male.  27 yo M with a cc of R flank pain.  Started about a week ago lasted for a couple hours and then resolved.  Reoccured last night.  Unrelenting sharp.  Can't find a comfortable position.  Patient with hx of kidney stone.  Thinks this maybe feels similar.  No fevers.  Increased urination.    Flank Pain       Home Medications Prior to Admission medications   Medication Sig Start Date End Date Taking? Authorizing Provider  morphine (MSIR) 15 MG tablet Take 0.5 tablets (7.5 mg total) by mouth every 4 (four) hours as needed for severe pain. 12/08/22  Yes Melene Plan, DO  ondansetron (ZOFRAN-ODT) 4 MG disintegrating tablet 4mg  ODT q4 hours prn nausea/vomit 12/08/22  Yes Melene Plan, DO  tamsulosin (FLOMAX) 0.4 MG CAPS capsule Take 1 capsule (0.4 mg total) by mouth daily after supper. 12/08/22  Yes Melene Plan, DO  benzonatate (TESSALON) 100 MG capsule Take 1 capsule (100 mg total) by mouth 3 (three) times daily as needed for cough. Patient not taking: Reported on 09/23/2022 08/25/22   Zenia Resides, MD  fluticasone [facility]) 50 MCG/ACT nasal spray Place 2 sprays into both nostrils daily. Patient not taking: Reported on 09/23/2022 08/18/18   Belinda Fisher, PA-C      Allergies    Patient has no known allergies.    Review of Systems   Review of Systems  Genitourinary:  Positive for flank pain.    Physical Exam Updated Vital Signs BP (!) 125/52   Pulse 69   Temp 97.6 F (36.4 C) (Oral)   Resp 18   Ht 6' (1.829 m)   Wt 131.5 kg   SpO2 96%   BMI 39.33 kg/m  Physical Exam Vitals and nursing note reviewed.  Constitutional:      Appearance: He is well-developed.     Comments: Patient is rocking back and forth on the bed.  HENT:     Head:  Normocephalic and atraumatic.  Eyes:     Pupils: Pupils are equal, round, and reactive to light.  Neck:     Vascular: No JVD.  Cardiovascular:     Rate and Rhythm: Normal rate and regular rhythm.     Heart sounds: No murmur heard.    No friction rub. No gallop.  Pulmonary:     Effort: No respiratory distress.     Breath sounds: No wheezing.  Abdominal:     General: There is no distension.     Tenderness: There is no abdominal tenderness. There is no guarding or rebound.  Musculoskeletal:        General: Normal range of motion.     Cervical back: Normal range of motion and neck supple.  Skin:    Coloration: Skin is not pale.     Findings: No rash.  Neurological:     Mental Status: He is alert and oriented to person, place, and time.  Psychiatric:        Behavior: Behavior normal.     ED Results / Procedures / Treatments   Labs (all labs ordered are listed, but only abnormal results are displayed) Labs Reviewed  URINALYSIS, ROUTINE W REFLEX  MICROSCOPIC - Abnormal; Notable for the following components:      Result Value   APPearance TURBID (*)    Hgb urine dipstick MODERATE (*)    Bacteria, UA RARE (*)    All other components within normal limits    EKG None  Radiology CT Renal Stone Study  Result Date: 12/08/2022 CLINICAL DATA:  Recurrent stabbing right flank pain EXAM: CT ABDOMEN AND PELVIS WITHOUT CONTRAST TECHNIQUE: Multidetector CT imaging of the abdomen and pelvis was performed following the standard protocol without IV contrast. RADIATION DOSE REDUCTION: This exam was performed according to the departmental dose-optimization program which includes automated exposure control, adjustment of the mA and/or kV according to patient size and/or use of iterative reconstruction technique. COMPARISON:  CT 06/14/2013 FINDINGS: Lower chest: There is some linear opacity lung bases likely scar or atelectasis. No pleural effusion. There is breathing motion. Hepatobiliary: Mild  fatty liver infiltration. Gallbladder is nondilated. Pancreas: Unremarkable. No pancreatic ductal dilatation or surrounding inflammatory changes. Spleen: Normal in size without focal abnormality. Adrenals/Urinary Tract: The adrenal glands are preserved. No left-sided renal or ureteral stones. No left-sided collecting system dilatation. Preserved parenchyma on this non IV contrast exam. Contracted urinary bladder. Intrarenal right-sided stones are seen measuring up to 4 mm. There is mild right-sided renal collecting system dilatation down to the level of the low pelvis where there is stone in the extreme distal right ureter proximal to the UVJ by proximally 2 cm. The stone on coronal image 62 of series 6 measures 5 mm. Stomach/Bowel: No oral contrast. There is moderate debris in the stomach. Small bowel is nondilated. Large bowel has nondilated. Scattered colonic stool. Normal appendix in the right lower quadrant. Vascular/Lymphatic: Normal caliber aorta and IVC. No abnormal retroperitoneal lymph node enlargement. There are some small mesenteric nodes identified which are not pathologic by size criteria. These were seen previously. Reproductive: Prostate is unremarkable. Other: No free air or free fluid. Musculoskeletal: No acute or significant osseous findings. IMPRESSION: 5 mm stone in the extreme distal right ureter with mild collecting system dilatation. Additional nonobstructing right-sided stones. Electronically Signed   By: Karen Kays M.D.   On: 12/08/2022 10:28    Procedures Procedures    Medications Ordered in ED Medications  sodium chloride 0.9 % bolus 1,000 mL (0 mLs Intravenous Stopped 12/08/22 1009)  ondansetron (ZOFRAN) injection 4 mg (4 mg Intravenous Given 12/08/22 0926)  morphine (PF) 4 MG/ML injection 4 mg (4 mg Intravenous Given 12/08/22 0926)  ketorolac (TORADOL) 15 MG/ML injection 15 mg (15 mg Intravenous Given 12/08/22 1014)    ED Course/ Medical Decision Making/ A&P                              Medical Decision Making Amount and/or Complexity of Data Reviewed Labs: ordered. Radiology: ordered.  Risk Prescription drug management.   27 yo M with a chief complaints of right flank pain.  Occurred about a week ago and then resolved after couple hours and then reoccurred last night and has not gone away.  Has a history of kidney stones and is unsure if this feels the same.  No fevers increased urinary output.  No abdominal pain on my exam.  Will obtain a CT stone study UA treat pain and reassess.  CT scan on my independent interpretation with the right UVJ stone with hydronephrosis.  Radiology read is the same.  Pain is much more controlled after narcotics and toradol.  11:09 AM:  I have discussed the diagnosis/risks/treatment options with the patient.  Evaluation and diagnostic testing in the emergency department does not suggest an emergent condition requiring admission or immediate intervention beyond what has been performed at this time.  They will follow up with Urology. We also discussed returning to the ED immediately if new or worsening sx occur. We discussed the sx which are most concerning (e.g., sudden worsening pain, fever, inability to tolerate by mouth) that necessitate immediate return. Medications administered to the patient during their visit and any new prescriptions provided to the patient are listed below.  Medications given during this visit Medications  sodium chloride 0.9 % bolus 1,000 mL (0 mLs Intravenous Stopped 12/08/22 1009)  ondansetron (ZOFRAN) injection 4 mg (4 mg Intravenous Given 12/08/22 0926)  morphine (PF) 4 MG/ML injection 4 mg (4 mg Intravenous Given 12/08/22 0926)  ketorolac (TORADOL) 15 MG/ML injection 15 mg (15 mg Intravenous Given 12/08/22 1014)     The patient appears reasonably screen and/or stabilized for discharge and I doubt any other medical condition or other [facility] requiring further screening, evaluation, or treatment in the ED  at this time prior to discharge.          Final Clinical Impression(s) / ED Diagnoses Final diagnoses:  Nephrolithiasis    Rx / DC Orders ED Discharge Orders          Ordered    ondansetron (ZOFRAN-ODT) 4 MG disintegrating tablet        12/08/22 1108    morphine (MSIR) 15 MG tablet  Every 4 hours PRN        12/08/22 1108    tamsulosin (FLOMAX) 0.4 MG CAPS capsule  Daily after supper        12/08/22 1108              Melene Plan, DO 12/08/22 1109

## 2022-12-08 NOTE — ED Triage Notes (Signed)
Pt has had a recurrent stabbing right flank pain last week since Tuesday of last week. Since Friday the pain has increased in frequency, and pain. Pt states he is still having the right flank pain, and it now refers across his hip into his groin. Pt endorses some nausea/diarrhea.

## 2023-05-30 ENCOUNTER — Encounter (HOSPITAL_COMMUNITY): Payer: Self-pay | Admitting: Family

## 2023-05-30 ENCOUNTER — Other Ambulatory Visit: Payer: Self-pay

## 2023-05-30 ENCOUNTER — Ambulatory Visit (HOSPITAL_COMMUNITY)
Admission: EM | Admit: 2023-05-30 | Discharge: 2023-05-30 | Disposition: A | Discharge status: Other Acute Inpatient Hospital | Payer: No Payment, Other | Attending: Family | Admitting: Family

## 2023-05-30 ENCOUNTER — Inpatient Hospital Stay (HOSPITAL_COMMUNITY)
Admission: AD | Admit: 2023-05-30 | Discharge: 2023-06-01 | DRG: 885 | Disposition: A | Discharge status: Home / Self Care | Payer: No Typology Code available for payment source | Source: Intra-hospital | Attending: Psychiatry | Admitting: Psychiatry

## 2023-05-30 DIAGNOSIS — R4589 Other symptoms and signs involving emotional state: Secondary | ICD-10-CM | POA: Diagnosis not present

## 2023-05-30 DIAGNOSIS — R45851 Suicidal ideations: Secondary | ICD-10-CM | POA: Diagnosis present

## 2023-05-30 DIAGNOSIS — Z87442 Personal history of urinary calculi: Secondary | ICD-10-CM

## 2023-05-30 DIAGNOSIS — Z560 Unemployment, unspecified: Secondary | ICD-10-CM

## 2023-05-30 DIAGNOSIS — Z9151 Personal history of suicidal behavior: Secondary | ICD-10-CM

## 2023-05-30 DIAGNOSIS — F332 Major depressive disorder, recurrent severe without psychotic features: Secondary | ICD-10-CM

## 2023-05-30 DIAGNOSIS — Z5982 Transportation insecurity: Secondary | ICD-10-CM | POA: Diagnosis not present

## 2023-05-30 DIAGNOSIS — F17210 Nicotine dependence, cigarettes, uncomplicated: Secondary | ICD-10-CM | POA: Diagnosis present

## 2023-05-30 LAB — POCT URINE DRUG SCREEN - MANUAL ENTRY (I-SCREEN)
POC Amphetamine UR: NOT DETECTED
POC Buprenorphine (BUP): NOT DETECTED
POC Cocaine UR: NOT DETECTED
POC Marijuana UR: NOT DETECTED
POC Methadone UR: NOT DETECTED
POC Methamphetamine UR: NOT DETECTED
POC Morphine: NOT DETECTED
POC Oxazepam (BZO): NOT DETECTED
POC Oxycodone UR: NOT DETECTED
POC Secobarbital (BAR): NOT DETECTED

## 2023-05-30 LAB — CBC
HCT: 49.8 % (ref 39.0–52.0)
Hemoglobin: 16.7 g/dL (ref 13.0–17.0)
MCH: 27.3 pg (ref 26.0–34.0)
MCHC: 33.5 g/dL (ref 30.0–36.0)
MCV: 81.5 fL (ref 80.0–100.0)
Platelets: 338 10*3/uL (ref 150–400)
RBC: 6.11 MIL/uL — ABNORMAL HIGH (ref 4.22–5.81)
RDW: 13.2 % (ref 11.5–15.5)
WBC: 7.1 10*3/uL (ref 4.0–10.5)
nRBC: 0 % (ref 0.0–0.2)

## 2023-05-30 LAB — COMPREHENSIVE METABOLIC PANEL
ALT: 38 U/L (ref 0–44)
AST: 24 U/L (ref 15–41)
Albumin: 4.1 g/dL (ref 3.5–5.0)
Alkaline Phosphatase: 47 U/L (ref 38–126)
Anion gap: 11 (ref 5–15)
BUN: 7 mg/dL (ref 6–20)
CO2: 26 mmol/L (ref 22–32)
Calcium: 10 mg/dL (ref 8.9–10.3)
Chloride: 103 mmol/L (ref 98–111)
Creatinine, Ser: 1.04 mg/dL (ref 0.61–1.24)
GFR, Estimated: >60 mL/min (ref >60)
Glucose, Bld: 78 mg/dL (ref 70–99)
Potassium: 4.6 mmol/L (ref 3.5–5.1)
Sodium: 140 mmol/L (ref 135–145)
Total Bilirubin: 0.7 mg/dL (ref <1.2)
Total Protein: 7.5 g/dL (ref 6.5–8.1)

## 2023-05-30 LAB — URINALYSIS, ROUTINE W REFLEX MICROSCOPIC
Bilirubin Urine: NEGATIVE
Glucose, UA: NEGATIVE mg/dL
Hgb urine dipstick: NEGATIVE
Ketones, ur: NEGATIVE mg/dL
Leukocytes,Ua: NEGATIVE
Nitrite: NEGATIVE
Protein, ur: NEGATIVE mg/dL
Specific Gravity, Urine: 1.012 (ref 1.005–1.030)
pH: 5 (ref 5.0–8.0)

## 2023-05-30 LAB — TSH: TSH: 1.079 u[IU]/mL (ref 0.350–4.500)

## 2023-05-30 LAB — LIPID PANEL
Cholesterol: 196 mg/dL (ref 0–200)
HDL: 42 mg/dL (ref >40)
LDL Cholesterol: 124 mg/dL — ABNORMAL HIGH (ref 0–99)
Total CHOL/HDL Ratio: 4.7 {ratio}
Triglycerides: 148 mg/dL (ref <150)
VLDL: 30 mg/dL (ref 0–40)

## 2023-05-30 LAB — ETHANOL: Alcohol, Ethyl (B): <10 mg/dL (ref <10)

## 2023-05-30 LAB — HEMOGLOBIN A1C
Hgb A1c MFr Bld: 5 % (ref 4.8–5.6)
Mean Plasma Glucose: 96.8 mg/dL

## 2023-05-30 MED ORDER — MAGNESIUM HYDROXIDE 400 MG/5ML PO SUSP: 30.0000 mL | Freq: Every day | ORAL | Status: DC | PRN

## 2023-05-30 MED ORDER — ALUM & MAG HYDROXIDE-SIMETH 200-200-20 MG/5ML PO SUSP
30.0000 mL | ORAL | Status: DC | PRN
Start: 2023-05-30 — End: 2023-06-01

## 2023-05-30 MED ORDER — DIPHENHYDRAMINE HCL 50 MG/ML IJ SOLN: 50.0000 mg | Freq: Three times a day (TID) | INTRAMUSCULAR | Status: DC | PRN

## 2023-05-30 MED ORDER — LORAZEPAM 2 MG/ML IJ SOLN: 2.0000 mg | Freq: Three times a day (TID) | INTRAMUSCULAR | Status: DC | PRN

## 2023-05-30 MED ORDER — ACETAMINOPHEN 325 MG PO TABS
650.0000 mg | ORAL_TABLET | Freq: Four times a day (QID) | ORAL | Status: DC | PRN
  Administered 2023-05-31: 650 mg via ORAL
  Filled 2023-05-30: qty 2

## 2023-05-30 MED ORDER — HALOPERIDOL LACTATE 5 MG/ML IJ SOLN: 5.0000 mg | Freq: Three times a day (TID) | INTRAMUSCULAR | Status: DC | PRN

## 2023-05-30 NOTE — ED Notes (Signed)
Pt sitting in chair by bed at this hour. No apparent distress. RR even and unlabored. Monitored for safety.

## 2023-05-30 NOTE — Progress Notes (Signed)
Pt has been accepted to [facility] on 05/30/2023 Bed assignment: 301-2 pending Labs, VOL, EKG, UDS, VS  Pt meets inpatient criteria per: Malachy Chamber NP  Attending Physician will be. Dr. Sarita Bottom, MD   Report can be called to:Adult unit: [phone]  Pt can arrive after:pending Labs, VOL, EKG, UDS, VS   Care Team Notified: [facility] AC:   Rona Ravens RN, Malachy Chamber NP, Myrlene Broker RN  [place] Timmey Lamba LCSW-A   05/30/2023 12:35 PM

## 2023-05-30 NOTE — Tx Team (Signed)
Initial Treatment Plan 05/30/2023 9:48 PM [name]:[id number]    PATIENT STRESSORS: Financial difficulties   Occupational concerns     PATIENT STRENGTHS: Average or above average intelligence  Physical Health  Supportive family/friends    PATIENT IDENTIFIED PROBLEMS:   depression  Suicidal ideation  "Need someone to talk to"               DISCHARGE CRITERIA:  Improved stabilization in mood, thinking, and/or behavior Medical problems require only outpatient monitoring Need for constant or close observation no longer present Verbal commitment to aftercare and medication compliance  PRELIMINARY DISCHARGE PLAN: Outpatient therapy Return to previous living arrangement Return to previous work or school arrangements  PATIENT/FAMILY INVOLVEMENT: This treatment plan has been presented to and reviewed with the patient, Francis Reyes  The patient and family have been given the opportunity to ask questions and make suggestions.  Earnest Conroy, RN 05/30/2023, 9:48 PM

## 2023-05-30 NOTE — ED Notes (Signed)
Patient admitted to [facility] due to expressing suicidal ideation to a friend.  Friend was worried about him and brought him here.  Patient came in voluntarily however he is being IVC'd.  Patient is not happy about this however he is calm.  Patient stated " I made a mistake coming here."  Patient affect is dysphoric and he appears sad and is minimally verbal with poor eye contact.  Will monitor and provide safety on unit.

## 2023-05-30 NOTE — Progress Notes (Signed)
   05/30/23 1203  [facility] Triage Screening (Walk-ins at [facility] only)  How Did You Hear About [place]? Family/Friend  What Is the Reason for Your Visit/Call Today? Francis Reyes is a 27 year old male presenting to [facility] with GPD/BHRT with chief complaint of suicidal ideations. Patient reports telling his friend that "it would be best for everyone if I swallow a bullet". Patient denies having access to a gun but reports that his father has a gun that he takes to work with him daily. Patient reports suicidal ideations on and off for the past month. Patient reports multiple stressors to include financial issues, his car broke down, loss his job and he can't go back to trade school due to having no transportation. Patient reports multiple suicide attempts about 8-10 years ago by cutting his arm, taking pills and considered jumping off a bridge 8-10 years ago. Patient reports if his friend did not call the police he felt like he was going to harm himself. Patient does not have outpatient services, denies history of mental health diagnosis and no history of psychiatric inpatient treatment.  Pt denies HI, AVH, SIB, drug and alcohol use.  How Long Has This Been Causing You Problems? 1-6 months  Have You Recently Had Any Thoughts About Hurting Yourself? Yes  How long ago did you have thoughts about hurting yourself? month  Are You Planning to Commit Suicide/Harm Yourself At This time? No  Have you Recently Had Thoughts About Hurting Someone [name]? No  Are You Planning To Harm Someone At This Time? No  Physical Abuse Denies  Verbal Abuse Denies  Sexual Abuse Denies  Exploitation of patient/patient's resources Denies  Self-Neglect Denies  Are you currently experiencing any auditory, visual or other hallucinations? No  Have You Used Any Alcohol or Drugs in the Past 24 Hours? No  Do you have any current medical co-morbidities that require immediate attention? No  Clinician description of patient physical  appearance/behavior: calm  What Do You Feel Would Help You the Most Today? Treatment for Depression or other mood problem  If access to [facility] was not available, would you have sought care in the Emergency Department? No  Determination of Need Urgent (48 hours)  Options For Referral Inpatient Hospitalization;Intensive Outpatient Therapy;Medication Management;Outpatient Therapy  Determination of Need filed? Yes
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
## 2023-05-30 NOTE — Progress Notes (Signed)
Patient ID: Francis Reyes, male   DOB: 1996-05-26, 27 y.o.   MRN: [id number] Admission note: Patient is a Involuntary admission in no acute distress for depression and c/o suicidal ideation. pt reports not currently receiving outpatient services, no current medication. Pt is endorsing no past suicidal attempt by self harm. Pt admitted to unit per protocol, skin assessment and belonging search done. No skin issues noted. Consent signed by pt. Pt educated on therapeutic milieu rules. Pt was introduced to milieu by nursing staff. Fall risk / suicide safety plan explained to the patient. 15 minutes checks started for safety.
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
## 2023-05-31 DIAGNOSIS — F332 Major depressive disorder, recurrent severe without psychotic features: Secondary | ICD-10-CM | POA: Diagnosis not present

## 2023-05-31 LAB — VITAMIN D 25 HYDROXY (VIT D DEFICIENCY, FRACTURES): Vit D, 25-Hydroxy: 4.91 ng/mL — ABNORMAL LOW (ref 30–100)

## 2023-05-31 NOTE — Plan of Care (Signed)
  Problem: Education: Goal: Verbalization of understanding the information provided will improve Outcome: Progressing   Problem: Activity: Goal: Interest or engagement in activities will improve Outcome: Progressing   

## 2023-05-31 NOTE — Plan of Care (Signed)
  Problem: Education: Goal: Emotional status will improve Outcome: Progressing Goal: Mental status will improve Outcome: Progressing   

## 2023-05-31 NOTE — Group Note (Signed)
Date:  05/31/2023 Time:  9:45 PM  Group Topic/Focus:  Wrap-Up Group:   The focus of this group is to help patients review their daily goal of treatment and discuss progress on daily workbooks.    Participation Level:  Did Not Attend  Participation Quality:   N/A  Affect:   N/A  Cognitive:   N/A  Insight: None  Engagement in Group:   N/A  Modes of Intervention:   N/A  Additional Comments:  Patient did not attend wrap up group.   Kennieth Francois 05/31/2023, 9:45 PM

## 2023-05-31 NOTE — H&P (Signed)
Psychiatric Admission Assessment Adult  Patient Identification: Francis Reyes MRN:  [id number] Date of Evaluation:  05/31/2023 Chief Complaint:  MDD (major depressive disorder), recurrent episode, severe (HCC) [F33.2] Principal Diagnosis: MDD (major depressive disorder), recurrent episode, severe (HCC) Diagnosis:  Principal Problem:   MDD (major depressive disorder), recurrent episode, severe (HCC)  CC: " I called my friend and told her that I was a little sad and down due to financial difficulty this holidays season, however not suicidal or homicidal, she decided to call the cops to take me for evaluation."  History of Present Illness: Francis Reyes is a 27 years old AA male with no formal psychiatric diagnoses who presents involuntarily to [facility] from [facility] ED for worsening depression resulting in suicidal ideation in the context of psychosocial stressors of unemployment, withdrawal from the trade school, misdemeanor charge, not being able to continue kicks boxing activities due to lack of transportation, strained relationship from father at early age, and trauma from dad and mom's separation and divorce at early age.  After medical evaluation/stabilization & clearance, he was transferred to the [facility] for further psychiatric evaluation & treatments.  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  I also had childhood stressors when my dad and my mom separated and I was estranged from my father for no obvious reasons.  Further, my mom remarried and I did not  like my stepdad.  I attempted none suicidal self-cut with a box cutter when I was 27 years old.  I have never attempted overdosing with medications according to the chart report.  All these stressors got me overwhelmed especially during this Christmas season.  I do not have symptoms of OCD, PTSD, panic attacks, mania or psychosis.  I have never been admitted inpatient psychiatric hospital before not have any outpatient therapy.  I would rather be treated by therapies that medication at this point."  Evaluation: Patient was seen and examined sitting up in a chair in the office.  He presents with sad mood and congruent affect, frequently asking this provider when he can get out of the hospital.  Chart reviewed and findings shared with the treatment team and consult with attending psychiatrist.  He is alert, sad, and oriented to person, place, time, and situation.  Speech is clear, coherent with normal volume and pattern.  Able to maintain fair eye contact with this provider.  Objectively not responding to internal or external stimuli.  Thought processes and thought content coherent and relevance.  He denies delusional thinking or paranoia.  He appears to be goal directed without preoccupation or distractibility.  He currently denies SI, HI, or AVH.  He contracts for safety while in the hospital and after discharge.  Vital signs reviewed without critical values.  Labs and last EKG reviewed as indicated in the treatment plan.  Patient is admitted for stabilization, medication management, and safety.  Mode of transport to Hospital: Police car Current Outpatient (Home) Medication List: None PRN medication prior to evaluation: None  ED course: Labs and EKG were obtained and analyzed,  and patient disposition to [facility] Collateral Information: None obtained at this time POA/Legal Guardian:  Past Psychiatric Hx: Per chart review: The patient denies any history of formal psychiatric diagnoses, treatments, or  hospitalizations. However, he undisclosed a previous suicide attempt involving "taking some pills" but noted he did not require hospitalization or medical treatment at the time. Patient denies and drug or alcohol use. Patient does endorses pending misdemeanor charge but denies any violent crimes or felonies.  Previous Psych Diagnoses: Denies Prior inpatient treatment: Denies Current/prior outpatient treatment: Denies Prior rehab hx: Denies Psychotherapy hx: Denies History of suicide: X 1, self cut at 27 years old, never attempted overdose. History of homicide or aggression: Denies Psychiatric medication history: Denies Psychiatric medication compliance history: Never taking any psychotropic medication Neuromodulation history: Denies Current Psychiatrist: Denies Current therapist: Did not denies  Substance Abuse Hx: Alcohol: 1 or 2 beers twice a month Tobacco: No smoking Illicit drugs: Denies illicit drugs.  However, as per chart review: On 09/23/2022 at 0020 a.m., patient was treated at [facility] ED, at [facility] for altered mental status in the context of taking 200 mg of marijuana Gummies. Rx drug abuse: Denies drug abuse Rehab hx: Denies being in rehab for drug or alcohol  Past Medical History: Medical Diagnoses: Kidney stones x 2 Home Rx: Denies Prior Hosp: Denies Prior Surgeries/Trauma: Denies Head trauma, LOC, concussions, seizures: Denies Allergies: No known drug allergies LMP: Not applicable Contraception: Not applicable PCP: Denies  Family History: Medical: Kidney stones for uncles and grandfather Psych: Denies Psych Rx: Denies any SA/HA: Denies Substance use family hx: Family members smokes marijuana recreationally  Social History: Childhood (bring, raised, lives now, parents, siblings, schooling, education): High school diploma Abuse: Denies history of abuse.  However did not like the relationship between her mom and stepdad. Marital Status: Single Sexual  orientation: Male from birth Children: No children Employment: Currently unemployed, however has interviews scheduled for employment Peer Group: Denies peer group Housing: Lives with his father Finances: Some financial stress Legal: Psychologist, educational: Denies serving in the [employer]  Associated Signs/Symptoms: Depression Symptoms:   Patient expresses sadness rather than depression (Hypo) Manic Symptoms:   Denies Anxiety Symptoms:   Anxious about the holidays and financial stress Psychotic Symptoms:   Denies psychotic symptoms PTSD Symptoms: NA Total Time spent with patient: 1 hour  Is the patient at risk to self? No.  Has the patient been a risk to self in the past 6 months? No.  Has the patient been a risk to self within the distant past? Yes.    Is the patient a risk to others? No.  Has the patient been a risk to others in the past 6 months? No.  Has the patient been a risk to others within the distant past? No.   [place] Scale:  Flowsheet Row [facility] Most recent reading at 05/30/2023  9:41 PM ED from 05/30/2023 in [facility] Most recent reading at 05/30/2023  1:05 PM ED from 12/08/2022 in [facility] Department at [facility] Most recent reading at 12/08/2022  8:50 AM  C-SSRS RISK CATEGORY High Risk High Risk No Risk      Alcohol Screening: Patient refused Alcohol Screening Tool: Yes 1. How often do you have a drink containing alcohol?: Never 2. How many drinks containing alcohol do you have on a typical day when you are drinking?: 1 or 2 3. How often  do you have six or more drinks on one occasion?: Never AUDIT-C Score: 0 4. How often during the last year have you found that you were not able to stop drinking once you had started?: Never 5. How often during the last year have you failed to do what was normally expected from you because of  drinking?: Never 6. How often during the last year have you needed a first drink in the morning to get yourself going after a heavy drinking session?: Never 7. How often during the last year have you had a feeling of guilt of remorse after drinking?: Never 8. How often during the last year have you been unable to remember what happened the night before because you had been drinking?: Never 9. Have you or someone else been injured as a result of your drinking?: No 10. Has a relative or friend or a doctor or another health worker been concerned about your drinking or suggested you cut down?: No Alcohol Use Disorder Identification Test Final Score (AUDIT): 0 Alcohol Brief Interventions/Follow-up: Patient Refused Substance Abuse History in the last 12 months:  No. Consequences of Substance Abuse: Discussed with patient during this admission evaluation. Medical Consequences:  Liver damage, Possible death by overdose Legal Consequences:  Arrests, jail time, Loss of driving privilege. Family Consequences:  Family discord, divorce and or separation.  Previous Psychotropic Medications: No   Psychological Evaluations: No   Past Medical History:  Past Medical History:  Diagnosis Date   Kidney stones    History reviewed. No pertinent surgical history. Family History: History reviewed. No pertinent family history.  Family Psychiatric  History: Family members smoke marijuana recreationally Tobacco Screening:  Social History   Tobacco Use  Smoking Status Some Days   Current packs/day: 0.50   Types: Cigarettes  Smokeless Tobacco Not on file    BH Tobacco Counseling     Are you interested in Tobacco Cessation Medications?  No value filed. Counseled patient on smoking cessation:  No value filed. Reason Tobacco Screening Not Completed: No value filed.    Social History:  Social History   Substance and Sexual Activity  Alcohol Use Yes   Comment: occasionally     Social History    Substance and Sexual Activity  Drug Use Yes    Additional Social History:   Allergies:  No Known Allergies Lab Results:  Results for orders placed or performed during the hospital encounter of 05/30/23 (from the past 48 hour(s))  Hemoglobin A1c     Status: None   Collection Time: 05/30/23 12:20 PM  Result Value Ref Range   Hgb A1c MFr Bld 5.0 4.8 - 5.6 %    Comment: (NOTE) Pre diabetes:          5.7%-6.4%  Diabetes:              >6.4%  Glycemic control for   <7.0% adults with diabetes    Mean Plasma Glucose 96.8 mg/dL    Comment: Performed at [facility], [address] [place], [place] [address]  Lipid panel     Status: Abnormal   Collection Time: 05/30/23 12:20 PM  Result Value Ref Range   Cholesterol 196 0 - 200 mg/dL   Triglycerides 010 <272 mg/dL   HDL 42 >53 mg/dL   Total CHOL/HDL Ratio 4.7 RATIO   VLDL 30 0 - 40 mg/dL   LDL Cholesterol 664 (H) 0 - 99 mg/dL    Comment:  Total Cholesterol/HDL:CHD Risk Coronary Heart Disease Risk Table                     Men   Women  1/2 Average Risk   3.4   3.3  Average Risk       5.0   4.4  2 X Average Risk   9.6   7.1  3 X Average Risk  23.4   11.0        Use the calculated Patient Ratio above and the CHD Risk Table to determine the patient's CHD Risk.        ATP III CLASSIFICATION (LDL):  <100     mg/dL   Optimal  578-469  mg/dL   Near or Above                    Optimal  130-159  mg/dL   Borderline  629-528  mg/dL   High  >413     mg/dL   Very High Performed at [facility], [address], [place], [place] [address]   TSH     Status: None   Collection Time: 05/30/23 12:20 PM  Result Value Ref Range   TSH 1.079 0.350 - 4.500 uIU/mL    Comment: Performed by a 3rd Generation assay with a functional sensitivity of <=0.01 uIU/mL. Performed at [facility], [address] [place], [place] [address]   CBC     Status: Abnormal   Collection Time: 05/30/23 12:20 PM  Result Value Ref Range    WBC 7.1 4.0 - 10.5 K/uL   RBC 6.11 (H) 4.22 - 5.81 MIL/uL   Hemoglobin 16.7 13.0 - 17.0 g/dL   HCT 36.6 44.0 - 34.7 %   MCV 81.5 80.0 - 100.0 fL   MCH 27.3 26.0 - 34.0 pg   MCHC 33.5 30.0 - 36.0 g/dL   RDW 42.5 95.6 - 38.7 %   Platelets 338 150 - 400 K/uL   nRBC 0.0 0.0 - 0.2 %    Comment: Performed at [facility], [address], [place], [place] [address]  Comprehensive metabolic panel     Status: None   Collection Time: 05/30/23 12:20 PM  Result Value Ref Range   Sodium 140 135 - 145 mmol/L   Potassium 4.6 3.5 - 5.1 mmol/L   Chloride 103 98 - 111 mmol/L   CO2 26 22 - 32 mmol/L   Glucose, Bld 78 70 - 99 mg/dL    Comment: Glucose reference range applies only to samples taken after fasting for at least 8 hours.   BUN 7 6 - 20 mg/dL   Creatinine, Ser 2.95 0.61 - 1.24 mg/dL   Calcium 18.8 8.9 - 41.6 mg/dL   Total Protein 7.5 6.5 - 8.1 g/dL   Albumin 4.1 3.5 - 5.0 g/dL   AST 24 15 - 41 U/L   ALT 38 0 - 44 U/L   Alkaline Phosphatase 47 38 - 126 U/L   Total Bilirubin 0.7 <1.2 mg/dL   GFR, Estimated >60 >63 mL/min    Comment: (NOTE) Calculated using the CKD-EPI Creatinine Equation (2021)    Anion gap 11 5 - 15    Comment: Performed at [facility], [address] [place], [place] [address]  Ethanol     Status: None   Collection Time: 05/30/23 12:20 PM  Result Value Ref Range   Alcohol, Ethyl (B) <10 <10 mg/dL    Comment: (  NOTE) Lowest detectable limit for serum alcohol is 10 mg/dL.  For medical purposes only. Performed at [facility], [address] [place], [place] [address]   POCT Urine Drug Screen - (I-Screen)     Status: None   Collection Time: 05/30/23  1:08 PM  Result Value Ref Range   POC Amphetamine UR None Detected NONE DETECTED (Cut Off Level 1000 ng/mL)   POC Secobarbital (BAR) None Detected NONE DETECTED (Cut Off Level 300 ng/mL)   POC Buprenorphine (BUP) None Detected NONE DETECTED (Cut Off Level 10 ng/mL)   POC Oxazepam (BZO) None  Detected NONE DETECTED (Cut Off Level 300 ng/mL)   POC Cocaine UR None Detected NONE DETECTED (Cut Off Level 300 ng/mL)   POC Methamphetamine UR None Detected NONE DETECTED (Cut Off Level 1000 ng/mL)   POC Morphine None Detected NONE DETECTED (Cut Off Level 300 ng/mL)   POC Methadone UR None Detected NONE DETECTED (Cut Off Level 300 ng/mL)   POC Oxycodone UR None Detected NONE DETECTED (Cut Off Level 100 ng/mL)   POC Marijuana UR None Detected NONE DETECTED (Cut Off Level 50 ng/mL)  Urinalysis, Routine w reflex microscopic -Urine, Random     Status: None   Collection Time: 05/30/23  1:08 PM  Result Value Ref Range   Color, Urine YELLOW YELLOW   APPearance CLEAR CLEAR   Specific Gravity, Urine 1.012 1.005 - 1.030   pH 5.0 5.0 - 8.0   Glucose, UA NEGATIVE NEGATIVE mg/dL   Hgb urine dipstick NEGATIVE NEGATIVE   Bilirubin Urine NEGATIVE NEGATIVE   Ketones, ur NEGATIVE NEGATIVE mg/dL   Protein, ur NEGATIVE NEGATIVE mg/dL   Nitrite NEGATIVE NEGATIVE   Leukocytes,Ua NEGATIVE NEGATIVE    Comment: Performed at [facility], [address], [place], [place] [address]   Blood Alcohol level:  Lab Results  Component Value Date   [facility] <10 05/30/2023   Metabolic Disorder Labs:  Lab Results  Component Value Date   HGBA1C 5.0 05/30/2023   MPG 96.8 05/30/2023   No results found for: "PROLACTIN" Lab Results  Component Value Date   CHOL 196 05/30/2023   TRIG 148 05/30/2023   HDL 42 05/30/2023   CHOLHDL 4.7 05/30/2023   VLDL 30 05/30/2023   LDLCALC 124 (H) 05/30/2023   Current Medications: Current Facility-Administered Medications  Medication Dose Route Frequency Provider Last Rate Last Admin   acetaminophen (TYLENOL) tablet 650 mg  650 mg Oral Q6H PRN Starkes-Perry, Juel Burrow, FNP       alum & mag hydroxide-simeth (MAALOX/MYLANTA) 200-200-20 MG/5ML suspension 30 mL  30 mL Oral Q4H PRN Starkes-Perry, Juel Burrow, FNP       haloperidol lactate (HALDOL) injection 5 mg  5 mg Intramuscular TID  PRN Starkes-Perry, Juel Burrow, FNP       And   diphenhydrAMINE (BENADRYL) injection 50 mg  50 mg Intramuscular TID PRN Starkes-Perry, Juel Burrow, FNP       And   LORazepam (ATIVAN) injection 2 mg  2 mg Intramuscular TID PRN Starkes-Perry, Juel Burrow, FNP       magnesium hydroxide (MILK OF MAGNESIA) suspension 30 mL  30 mL Oral Daily PRN Starkes-Perry, Juel Burrow, FNP       PTA Medications: No medications prior to admission.   Musculoskeletal: Strength & Muscle Tone: within normal limits Gait & Station: normal Patient leans: N/A  Psychiatric Specialty Exam:  Presentation  General Appearance:  Casual; Fairly Groomed  Eye Contact: Fair  Speech: Clear and Coherent  Speech Volume: Normal  Handedness: Right  Mood and Affect  Mood: Anxious  Affect: Congruent  Thought Process  Thought Processes: Coherent; Linear  Duration of Psychotic Symptoms:N/A Past Diagnosis of Schizophrenia or Psychoactive disorder: No  Descriptions of Associations:Intact  Orientation:Full (Time, Place and Person)  Thought Content:Logical  Hallucinations:Hallucinations: None  Ideas of Reference:None  Suicidal Thoughts:Suicidal Thoughts: No SI Active Intent and/or Plan: -- (Denies)  Homicidal Thoughts:Homicidal Thoughts: No  Sensorium  Memory: Immediate Good; Recent Good  Judgment: Fair  Insight: Fair  Art therapist  Concentration: Good  Attention Span: Good  Recall: Fair  Fund of Knowledge: Fair  Language: Good  Psychomotor Activity  Psychomotor Activity: Psychomotor Activity: Normal  Assets  Assets: Communication Skills; Desire for Improvement; Housing; Physical Health; Resilience; Social Support  Sleep  Sleep: Sleep: Good Number of Hours of Sleep: 8  Physical Exam: Physical Exam Vitals and nursing note reviewed.  Constitutional:      Appearance: Normal appearance.  HENT:     Head: Normocephalic.     Nose: Nose normal.     Mouth/Throat:     Mouth:  Mucous membranes are moist.  Eyes:     Extraocular Movements: Extraocular movements intact.  Cardiovascular:     Rate and Rhythm: Normal rate.     Pulses: Normal pulses.  Pulmonary:     Effort: Pulmonary effort is normal.  Abdominal:     Comments: Deferred  Genitourinary:    Comments: Deferred  Musculoskeletal:        General: Normal range of motion.     Cervical back: Normal range of motion.  Skin:    General: Skin is warm.  Neurological:     General: No focal deficit present.     Mental Status: He is alert and oriented to person, place, and time.  Psychiatric:        Mood and Affect: Mood normal.        Behavior: Behavior normal.        Thought Content: Thought content normal.    Review of Systems  Constitutional:  Negative for chills and fever.  HENT:  Negative for sore throat.   Eyes:  Negative for blurred vision.  Respiratory:  Negative for cough, sputum production, shortness of breath and wheezing.   Cardiovascular:  Negative for chest pain and palpitations.  Gastrointestinal:  Negative for abdominal pain, constipation, diarrhea, heartburn, nausea and vomiting.  Genitourinary:  Negative for dysuria, frequency and urgency.  Musculoskeletal: Negative.   Skin:  Negative for itching and rash.  Neurological:  Negative for dizziness, tingling, tremors, sensory change, seizures and headaches.  Endo/Heme/Allergies:        No known drug allergies  Psychiatric/Behavioral:  Positive for depression. The patient is nervous/anxious.    Blood pressure (!) 135/90, pulse 88, temperature 98.1 F (36.7 C), temperature source Oral, resp. rate 18, height 6' (1.829 m), weight 136.1 kg, SpO2 100%. Body mass index is 40.69 kg/m.  Treatment Plan Summary: Daily contact with patient to assess and evaluate symptoms and progress in treatment and Medication management  Physician Treatment Plan for Primary Diagnosis:  Assessment: Francis Reyes is a 27 years old AA male with no formal  psychiatric diagnoses who presents involuntarily to [facility] from [facility] ED for worsening depression resulting in suicidal ideation in the context of psychosocial stressors during this holiday season.  MDD (major depressive disorder), recurrent episode, severe (HCC)  Plans: Medications: Patient refusing initiation of antidepressant at this time Patient refusing initiation of  nicotine replacement protocol at this time  Agitation protocol: Benadryl capsule 50 mg p.o. or IM 3 times daily as needed agitation   Haldol tablets 5 mg po IM 3 times daily as needed agitation   Lorazepam tablet 2 mg p.o. or IM 3 times daily as needed agitation    Other PRN Medications -Acetaminophen 650 mg every 6 as needed/mild pain -Maalox 30 mL oral every 4 as needed/digestion -Magnesium hydroxide 30 mL daily as needed/mild constipation  -- The risks/benefits/side-effects/alternatives to this medication were discussed in detail with the patient and time was given for questions. The patient consents to medication trial.  -- Metabolic profile and EKG monitoring obtained while on an atypical antipsychotic (BMI: Lipid Panel: HbgA1c: QTc:)  -- Encouraged patient to participate in unit milieu and in scheduled group therapies   Admission Labs reviewed: CMP: Without derangement electrolytes, all values within normal limits.  Lipid profile: LDL 124 elevated, otherwise within normal limits.  CBC: RBC 6.11 elevated, otherwise normal.  Hemoglobin A1c: 5.0 normal.  TSH: 1.079 normal.  Urinalysis: Within normal limits.  UDS: No substance dictated.  BAL: Less than 10  New labs ordered: Vitamin D 25-hydroxy  EKG reviewed: No recent EKG.  09/23/2022: Sinus rhythm, ventricular rate 93, QT/QTc 361/449   Safety and Monitoring: Voluntary admission to inpatient psychiatric unit for safety, stabilization and treatment Daily contact with patient to assess and evaluate symptoms and progress in treatment Patient's case to  be discussed in multi-disciplinary team meeting Observation Level : q15 minute checks Vital signs: q12 hours Precautions: suicide, but pt currently verbally contracts for safety on unit    Discharge Planning: Social work and case management to assist with discharge planning and identification of hospital follow-up needs prior to discharge Estimated LOS: 5-7 days Discharge Concerns: Need to establish a safety plan; Medication compliance and effectiveness Discharge Goals: Return home with outpatient referrals for mental health follow-up including medication management/psychotherapy.   Long Term Goal(s): Improvement in symptoms so as ready for discharge  Short Term Goals: Ability to identify changes in lifestyle to reduce recurrence of condition will improve, Ability to verbalize feelings will improve, Ability to disclose and discuss suicidal ideas, Ability to demonstrate self-control will improve, Ability to identify and develop effective coping behaviors will improve, Ability to maintain clinical measurements within normal limits will improve, Compliance with prescribed medications will improve, and Ability to identify triggers associated with substance abuse/mental health issues will improve  Physician Treatment Plan for Secondary Diagnosis: Principal Problem:   MDD (major depressive disorder), recurrent episode, severe (HCC)  I certify that inpatient services furnished can reasonably be expected to improve the patient's condition.    Cecilie Lowers, FNP 12/3/20249:51 AM

## 2023-05-31 NOTE — Group Note (Signed)
Recreation Therapy Group Note   Group Topic:Animal Assisted Therapy   Group Date: 05/31/2023 Start Time: 5409 End Time: 1033 Facilitators: Aakash Hollomon-McCall, LRT,CTRS Location: 300 Hall Dayroom   Animal-Assisted Activity (AAA) Program Checklist/Progress Notes Patient Eligibility Criteria Checklist & Daily Group note for Rec Tx Intervention  AAA/T Program Assumption of Risk Form signed by Patient/ or Parent Legal Guardian Yes  Patient understands his/her participation is voluntary Yes  Education: Charity fundraiser, Appropriate Animal Interaction   Education Outcome: Acknowledges education.    Affect/Mood: N/A   Participation Level: Did not attend    Clinical Observations/Individualized Feedback:      Plan: Continue to engage patient in RT group sessions 2-3x/week.   Yassin Scales-McCall, LRT,CTRS 05/31/2023 12:03 PM

## 2023-05-31 NOTE — [facility] Counselor (Signed)
Adult Comprehensive Assessment  Patient ID: Francis Reyes, male   DOB: 1995/10/20, 27 y.o.   MRN: [id number]  Information Source: Information source: Patient  Current Stressors:  Patient states their primary concerns and needs for treatment are:: "just sad about some things and it got blown out of proportion" Patient states their goals for this hospitilization and ongoing recovery are:: "nothing that i can think of, just had a moment yesterday" Educational / Learning stressors: "no" Employment / Job issues: "im out of work but have some interview this week" Family Relationships: "noEngineer, petroleum / Lack of resources (include bankruptcy): "nothing i cant really handle" Housing / Lack of housing: "no" Physical health (include injuries & life threatening diseases): "im fine but have a tooth that i need to take care of" Social relationships: "no" Substance abuse: "none" Bereavement / Loss: "no"  Living/Environment/Situation:  Living Arrangements: Parent Living conditions (as described by patient or guardian): "pretty good" Who else lives in the home?: just pt and dad How long has patient lived in current situation?: "since March 2024" What is atmosphere in current home: Other (Comment)  Family History:  Marital status: Single Does patient have children?: No  Childhood History:  By whom was/is the patient raised?: Both parents Description of patient's relationship with caregiver when they were a child: "with mom it was rough for a while with dad it was always good" Patient's description of current relationship with people who raised him/her: "good" How were you disciplined when you got in trouble as a child/adolescent?: "punishment, things taken away, popped here and there" Does patient have siblings?: Yes Number of Siblings: 4 Description of patient's current relationship with siblings: "i have a relationship with them" Did patient suffer any verbal/emotional/physical/sexual abuse  as a child?: No Did patient suffer from severe childhood neglect?: No Has patient ever been sexually abused/assaulted/raped as an adolescent or adult?: No Was the patient ever a victim of a crime or a disaster?: No Witnessed domestic violence?: No Has patient been affected by domestic violence as an adult?: No  Education:  Highest grade of school patient has completed: "high school graduate" Currently a student?: No Learning disability?: No  Employment/Work Situation:   Employment Situation: Unemployed Patient's Job has Been Impacted by Current Illness: No Describe how Patient's Job has Been Impacted: "lost job before this impatient treatment" What is the Longest Time Patient has Held a Job?: "2.5 years" Has Patient ever Been in the [employer]?: No  Financial Resources:   Surveyor, quantity resources: Medicare, Medicaid Does patient have a Lawyer or guardian?: No  Alcohol/Substance Abuse:   What has been your use of drugs/alcohol within the last 12 months?: alcohol If attempted suicide, did drugs/alcohol play a role in this?: No Has alcohol/substance abuse ever caused legal problems?: No  Social Support System:   Conservation officer, nature Support System: Good Describe Community Support System: Engineer, mining and friends" Type of faith/religion: "none" How does patient's faith help to cope with current illness?: n/a  Leisure/Recreation:   Do You Have Hobbies?: Yes Leisure and Hobbies: "kickboxing, video games, boxing , drawign and hanging with friends"  Strengths/Needs:   What is the patient's perception of their strengths?: "leadership and being devoted and hardworker" Patient states they can use these personal strengths during their treatment to contribute to their recovery: "i have no Idea" Patient states these barriers may affect/interfere with their treatment: "no" Patient states these barriers may affect their return to the community: "no" Other important information patient  would like considered in  planning for their treatment: "no"  Discharge Plan:   Currently receiving community mental health services: No Patient states concerns and preferences for aftercare planning are: "no concerns" Patient states they will know when they are safe and ready for discharge when: "i feel safe and ready now " Does patient have access to transportation?: Yes Does patient have financial barriers related to discharge medications?: No Patient description of barriers related to discharge medications: Pt has trillium Will patient be returning to same living situation after discharge?: Yes  Summary/Recommendations:   Summary and Recommendations (to be completed by the evaluator): Pt is a 27 y.o male IVC for inpatient treament due to increased depression resulting in suicidal ideation in the context of psychosocial stressors of unemployment and withdrawal from the trade school. Pt denies SI/HI/AVH/[place]. Pt would benefit from therapy in the community to include crisis intervention and additional supports. Pt denied the need for medication but agrees to therapy.  Steffanie Dunn. LCSWA 05/31/2023

## 2023-05-31 NOTE — [facility] Suicide Risk Assessment (Signed)
Suicide Risk Assessment  Admission Assessment    [facility] Admission Suicide Risk Assessment   Nursing information obtained from:  Review of record Demographic factors:  Male, Access to firearms Current Mental Status:  Suicidal ideation indicated by patient Loss Factors:  Decrease in vocational status Historical Factors:  Prior suicide attempts Risk Reduction Factors:  Sense of responsibility to family  Total Time spent with patient: 30 minutes Principal Problem: MDD (major depressive disorder), recurrent episode, severe (HCC) Diagnosis:  Principal Problem:   MDD (major depressive disorder), recurrent episode, severe (HCC)  Subjective Data: Francis Reyes is a 27 years old AA male with no formal psychiatric diagnoses who presents involuntarily to [facility] for worsening depression resulting in suicidal ideation in the context of psychosocial stressors of unemployment, withdrawal from the trade school, misdemeanor charge, not being able to continue kicks boxing activities due to lack of transportation, strained relationship from father at early age, and trauma from dad and mom's separation and divorce at early age.  Continued Clinical Symptoms:  Alcohol Use Disorder Identification Test Final Score (AUDIT): 0 The "Alcohol Use Disorders Identification Test", Guidelines for Use in Primary Care, Second Edition.  World Science writer [facility]). Score between 0-7:  no or low risk or alcohol related problems. Score between 8-15:  moderate risk of alcohol related problems. Score between 16-19:  high risk of alcohol related problems. Score 20 or above:  warrants further diagnostic evaluation for alcohol dependence and treatment.  CLINICAL FACTORS:   Depression:   Recent sense of peace/wellbeing Medical Diagnoses and Treatments/Surgeries  Musculoskeletal: Strength & Muscle Tone: within normal limits Gait & Station: normal Patient leans: N/A  Psychiatric Specialty  Exam:  Presentation  General Appearance:  Casual; Fairly Groomed  Eye Contact: Fair  Speech: Clear and Coherent  Speech Volume: Normal  Handedness: Right  Mood and Affect  Mood: Anxious  Affect: Congruent  Thought Process  Thought Processes: Coherent; Linear  Descriptions of Associations:Intact  Orientation:Full (Time, Place and Person)  Thought Content:Logical  History of Schizophrenia/Schizoaffective disorder:No  Duration of Psychotic Symptoms:No data recorded Hallucinations:Hallucinations: None  Ideas of Reference:None  Suicidal Thoughts:Suicidal Thoughts: No SI Active Intent and/or Plan: -- (Denies)  Homicidal Thoughts:Homicidal Thoughts: No  Sensorium  Memory: Immediate Good; Recent Good  Judgment: Fair  Insight: Fair  Art therapist  Concentration: Good  Attention Span: Good  Recall: Fair  Fund of Knowledge: Fair  Language: Good  Psychomotor Activity  Psychomotor Activity: Psychomotor Activity: Normal  Assets  Assets: Communication Skills; Desire for Improvement; Housing; Physical Health; Resilience; Social Support  Sleep  Sleep: Sleep: Good Number of Hours of Sleep: 8  Physical Exam: Physical Exam Vitals and nursing note reviewed.  HENT:     Head: Normocephalic.     Nose: Nose normal.     Mouth/Throat:     Mouth: Mucous membranes are moist.     Pharynx: Oropharynx is clear.  Eyes:     Extraocular Movements: Extraocular movements intact.  Cardiovascular:     Rate and Rhythm: Normal rate.     Pulses: Normal pulses.  Pulmonary:     Effort: Pulmonary effort is normal.  Abdominal:     Comments: Deferred  Genitourinary:    Comments: Deferred Musculoskeletal:        General: Normal range of motion.     Cervical back: Normal range of motion.  Skin:    General: Skin is warm.  Neurological:     General: No focal deficit present.  Mental Status: He is alert and oriented to person, place, and time.   Psychiatric:        Mood and Affect: Mood normal.        Behavior: Behavior normal.        Thought Content: Thought content normal.    Review of Systems  Constitutional:  Negative for chills and fever.  HENT:  Negative for sore throat.   Eyes:  Negative for blurred vision.  Respiratory:  Negative for cough, sputum production, shortness of breath and wheezing.   Cardiovascular:  Negative for chest pain and palpitations.  Gastrointestinal:  Negative for abdominal pain, constipation, diarrhea, heartburn, nausea and vomiting.  Genitourinary:  Negative for dysuria, frequency and urgency.  Musculoskeletal: Negative.   Skin:  Negative for itching and rash.  Neurological:  Negative for dizziness, tingling, tremors, sensory change, speech change, seizures and headaches.  Endo/Heme/Allergies:        No known drug allergies  Psychiatric/Behavioral:  Positive for depression. The patient is nervous/anxious.    Blood pressure (!) 135/90, pulse 88, temperature 98.1 F (36.7 C), temperature source Oral, resp. rate 18, height 6' (1.829 m), weight 136.1 kg, SpO2 100%. Body mass index is 40.69 kg/m.   COGNITIVE FEATURES THAT CONTRIBUTE TO RISK:  Polarized thinking    SUICIDE RISK:   Moderate:  Frequent suicidal ideation with limited intensity, and duration, some specificity in terms of plans, no associated intent, good self-control, limited dysphoria/symptomatology, some risk factors present, and identifiable protective factors, including available and accessible social support.  PLAN OF CARE:  Physician Treatment Plan for Primary Diagnosis:  Assessment: [name] is a 27 years old AA male with no formal psychiatric diagnoses who presents [facility]unt[facility] to [facility] ED for worsening depression resulting in suicidal ideation in the context of psychosocial stressors during this holiday season.  MDD (major depressive disorder), recurrent episode, severe  (HCC)  Plans: Medications: Patient refusing initiation of antidepressant at this time Patient refusing initiation of nicotine replacement protocol at this time  Agitation protocol: Benadryl capsule 50 mg p.o. or IM 3 times daily as needed agitation   Haldol tablets 5 mg po IM 3 times daily as needed agitation   Lorazepam tablet 2 mg p.o. or IM 3 times daily as needed agitation    Other PRN Medications -Acetaminophen 650 mg every 6 as needed/mild pain -Maalox 30 mL oral every 4 as needed/digestion -Magnesium hydroxide 30 mL daily as needed/mild constipation  -- The risks/benefits/side-effects/alternatives to this medication were discussed in detail with the patient and time was given for questions. The patient consents to medication trial.  -- Metabolic profile and EKG monitoring obtained while on an atypical antipsychotic (BMI: Lipid Panel: HbgA1c: QTc:)  -- Encouraged patient to participate in unit milieu and in scheduled group therapies   Admission Labs reviewed: CMP: Without derangement electrolytes, all values within normal limits.  Lipid profile: LDL 124 elevated, otherwise within normal limits.  CBC: RBC 6.11 elevated, otherwise normal.  Hemoglobin A1c: 5.0 normal.  TSH: 1.079 normal.  Urinalysis: Within normal limits.  UDS: No substance dictated.  BAL: Less than 10  New labs ordered: Vitamin D 25-hydroxy  EKG reviewed: No recent EKG.  09/23/2022: Sinus rhythm, ventricular rate 93, QT/QTc 361/449   Safety and Monitoring: Voluntary admission to inpatient psychiatric unit for safety, stabilization and treatment Daily contact with patient to assess and evaluate symptoms and progress in treatment Patient's case to be discussed in multi-disciplinary team meeting Observation Level :  q15 minute checks Vital signs: q12 hours Precautions: suicide, but pt currently verbally contracts for safety on unit    Discharge Planning: Social work and case management to assist with discharge  planning and identification of hospital follow-up needs prior to discharge Estimated LOS: 5-7 days Discharge Concerns: Need to establish a safety plan; Medication compliance and effectiveness Discharge Goals: Return home with outpatient referrals for mental health follow-up including medication management/psychotherapy.   Long Term Goal(s): Improvement in symptoms so as ready for discharge  Short Term Goals: Ability to identify changes in lifestyle to reduce recurrence of condition will improve, Ability to verbalize feelings will improve, Ability to disclose and discuss suicidal ideas, Ability to demonstrate self-control will improve, Ability to identify and develop effective coping behaviors will improve, Ability to maintain clinical measurements within normal limits will improve, Compliance with prescribed medications will improve, and Ability to identify triggers associated with substance abuse/mental health issues will improve  Physician Treatment Plan for Secondary Diagnosis: Principal Problem:   MDD (major depressive disorder), recurrent episode, severe (HCC)  I certify that inpatient services furnished can reasonably be expected to improve the patient's condition.   Cecilie Lowers, FNP 05/31/2023, 9:30 AM

## 2023-05-31 NOTE — B[facility] Suicide Risk Assessment (Signed)
[facility] INPATIENT:  Family/Significant Other Suicide Prevention Education  Suicide Prevention Education:  Patient Refusal for Family/Significant Other Suicide Prevention Education: The patient JANAI WEEK has refused to provide written consent for family/significant other to be provided Family/Significant Other Suicide Prevention Education during admission and/or prior to discharge.  Physician notified.  Suicide Prevention Education was reviewed thoroughly with patient, including risk factors, warning signs, and what to do.  Mobile Crisis services were described and that telephone number pointed out, with encouragement to patient to put this number in personal cell phone.  Brochure was provided to patient to share with natural supports.  Patient acknowledged the ways in which they are at risk, and how working through each of their issues can gradually start to reduce their risk factors.                                                  Patient was encouraged to think of the information in the context of people in their own lives.  Patient denied having access to firearms  Patient verbalized understanding of information provided.  Patient endorsed a desire to live.    Steffanie Dunn LCSWA 05/31/2023, 1:46 PM

## 2023-05-31 NOTE — Progress Notes (Addendum)
Pt denied SI/HI/AVH this morning. Pt has been isolative to his room for most of the day and interacts minimally on the unit. Q15 min checks verified for safety. Pt is safe on the unit.   05/31/23 1000  Psych Admission Type (Psych Patients Only)  Admission Status Involuntary  Psychosocial Assessment  Patient Complaints Other (Comment) ("I want to leave")  Eye Contact Fair  Facial Expression Flat  Affect Depressed  Speech Logical/coherent  Interaction Guarded;Isolative  Motor Activity Slow  Appearance/Hygiene Unremarkable  Behavior Characteristics Guarded;Irritable  Mood Depressed;Irritable  Thought Process  Coherency WDL  Content Blaming others  Delusions None reported or observed  Perception WDL  Hallucination None reported or observed  Judgment Impaired  Confusion None  Danger to Self  Current suicidal ideation? Denies  Agreement Not to Harm Self Yes  Description of Agreement Verbal  Danger to Others  Danger to Others None reported or observed

## 2023-06-01 DIAGNOSIS — F332 Major depressive disorder, recurrent severe without psychotic features: Secondary | ICD-10-CM | POA: Diagnosis not present

## 2023-06-01 NOTE — Progress Notes (Signed)
  [facility] Adult Case Management Discharge Plan :  Will you be returning to the same living situation after discharge:  Yes,  pt reported that he will be returning w/ dad At discharge, do you have transportation home?: No. CSW will coordinate transportation Do you have the ability to pay for your medications: Yes,  pt has trillium  Release of information consent forms completed and in the chart;  Patient's signature needed at discharge.  Patient to Follow up at:  Follow-up Information     [facility]. Go on 06/06/2023.   Specialty: Professional Counselor Why: Please go to this provider on 06/06/23 at 9:00 am to obtain therapy services.  You may also go during walk in hours for new patients:  Monday through Friday, from 9 am to 1 pm. Contact information: [address] [place] [address] [phone]         [facility]. Go on 06/14/2023.   Specialty: Behavioral Health Why: Please go to this provider for medication management services on 06/14/23 at 7:00 am. For fastest service, you may also go during walk in hours of Monday through Friday, arrive by 7:00 am, for same day service. Contact information: [address] [place] [address] [phone]               Pt declined medication management, provider information provided at discharge for future reference if needed.   Next level of care provider has access to [facility] Link:no  Safety Planning and Suicide Prevention discussed: Yes,  discussed with patient  Has patient been referred to the Quitline?: Patient does not use tobacco/nicotine products  Patient has been referred for addiction treatment: No known substance use disorder.  Steffanie Dunn, LCSWA  06/01/2023, 9:25 AM

## 2023-06-01 NOTE — Progress Notes (Signed)
   05/31/23 2010  Psych Admission Type (Psych Patients Only)  Admission Status Involuntary  Psychosocial Assessment  Patient Complaints None  Eye Contact Fair  Facial Expression Flat  Affect Depressed  Speech Logical/coherent  Interaction Guarded;Isolative  Motor Activity Slow  Appearance/Hygiene Unremarkable  Behavior Characteristics Guarded  Mood Depressed  Thought Process  Coherency WDL  Content Blaming others  Delusions None reported or observed  Perception WDL  Hallucination None reported or observed  Judgment Impaired  Confusion None  Danger to Self  Current suicidal ideation? Denies  Agreement Not to Harm Self Yes  Description of Agreement verbal  Danger to Others  Danger to Others None reported or observed

## 2023-06-01 NOTE — Discharge Summary (Signed)
Physician Discharge Summary Note  Patient:  Francis Reyes is an 27 y.o., male MRN:  [id number] DOB:  11-04-1995 Patient phone:  [phone] (home)  Patient address:   [address] [place] [address],  Total Time spent with patient: 30 minutes  Date of Admission:  05/30/2023 Date of Discharge:   06/01/2023  Reason for Admission:   Francis Reyes is a 70 years old AA male with no formal psychiatric diagnoses who presents involuntarily to [facility] from [facility] ED for worsening depression resulting in suicidal ideation in the context of psychosocial stressors of unemployment, withdrawal from the trade school, misdemeanor charge, not being able to continue kicks boxing activities due to lack of transportation, strained relationship from father at early age, and trauma from dad and mom's separation and divorce at early age.   Principal Problem: MDD (major depressive disorder), recurrent episode, severe (HCC) Discharge Diagnoses: Principal Problem:   MDD (major depressive disorder), recurrent episode, severe (HCC)  Past Psychiatric History: Per chart review: The patient denies any history of formal psychiatric diagnoses, treatments, or hospitalizations. However, he undisclosed a previous suicide attempt involving "taking some pills" but noted he did not require hospitalization or medical treatment at the time. Patient denies and drug or alcohol use. Patient does endorses pending misdemeanor charge but denies any violent crimes or felonies.  Previous Psych Diagnoses: Denies Prior inpatient treatment: Denies Current/prior outpatient treatment: Denies Prior rehab hx: Denies Psychotherapy hx: Denies History of suicide: X 1, self cut at 27 years old, never attempted overdose. History of homicide or aggression: Denies Psychiatric medication history: Denies Psychiatric medication compliance history: Never taking any psychotropic medication Neuromodulation history:  Denies Current Psychiatrist: Denies Current therapist: Did not denies  Past Medical History:  Past Medical History:  Diagnosis Date   Kidney stones    History reviewed. No pertinent surgical history. Family History: History reviewed. No pertinent family history. Family Psychiatric  History: See H&P Social History:  Social History   Substance and Sexual Activity  Alcohol Use Yes   Comment: occasionally     Social History   Substance and Sexual Activity  Drug Use Yes    Social History   Socioeconomic History   Marital status: Single    Spouse name: Not on file   Number of children: Not on file   Years of education: Not on file   Highest education level: Not on file  Occupational History   Not on file  Tobacco Use   Smoking status: Some Days    Current packs/day: 0.50    Types: Cigarettes   Smokeless tobacco: Not on file  Vaping Use   Vaping status: Unknown  Substance and Sexual Activity   Alcohol use: Yes    Comment: occasionally   Drug use: Yes   Sexual activity: Not on file  Other Topics Concern   Not on file  Social History Narrative   Not on file   Social Determinants of Health   Financial Resource Strain: Not on file  Food Insecurity: Patient Declined (05/30/2023)   Hunger Vital Sign    Worried About Running Out of Food in the Last Year: Patient declined    Ran Out of Food in the Last Year: Patient declined  Transportation Needs: Patient Declined (05/30/2023)   PRAPARE - Administrator, Civil Service (Medical): Patient declined    Lack of Transportation (Non-Medical): Patient declined  Recent Concern: Transportation Needs - Unmet Transportation Needs (05/30/2023)   PRAPARE -  Administrator, Civil Service (Medical): Yes    Lack of Transportation (Non-Medical): Yes  Physical Activity: Not on file  Stress: Not on file  Social Connections: Unknown (11/06/2021)   Received from [facility]   Social Network    Social  Network: Not on file   Hospital Course:  During the patient's hospitalization, patient had extensive initial psychiatric evaluation, and follow-up psychiatric evaluations every day.  Psychiatric diagnoses provided upon initial assessment: MDD recurrent episode severe Patient's psychiatric medications were adjusted on admission: None patient refusing psychotropic medication at this  During the hospitalization, other adjustments were made to the patient's psychiatric medication regimen: None  Patient's care was discussed during the interdisciplinary team meeting every day during the hospitalization.  Patient requiring only therapy for his sad and depressed mood at this time  Gradually, patient started adjusting to milieu. The patient was evaluated each day by a clinical provider to ascertain response to treatment. Improvement was noted by the patient's report of decreasing symptoms, improved sleep and appetite, affect, medication tolerance, behavior, and participation in unit programming.  Patient was asked each day to complete a self inventory noting mood, mental status, pain, new symptoms, anxiety and concerns.    Symptoms were reported as significantly decreased or resolved completely by discharge.   On day of discharge, the patient reports that their mood is stable. The patient denied having suicidal thoughts for more than 48 hours prior to discharge.  Patient denies having homicidal thoughts.  Patient denies having auditory hallucinations.  Patient denies any visual hallucinations or other symptoms of psychosis. The patient was motivated to continue taking medication with a goal of continued improvement in mental health.   The patient reports his target psychiatric symptoms of depression would benefit from therapy only and refuse psychotropic medications, and the patient reports overall benefit of psychiatric hospitalization. Supportive psychotherapy was provided to the patient. The patient  also participated in regular group therapy while hospitalized. Coping skills, problem solving as well as relaxation therapies were also part of the unit programming.  Labs were reviewed with the patient, and abnormal results were discussed with the patient.  The patient is able to verbalize their individual safety plan to this provider.  # It is recommended to the patient to continue psychiatric medications as prescribed, after discharge from the hospital.    # It is recommended to the patient to follow up with your outpatient psychiatric provider and PCP.  # It was discussed with the patient, the impact of alcohol, drugs, tobacco have been there overall psychiatric and medical wellbeing, and total abstinence from substance use was recommended the patient.ed.  # Prescriptions provided or sent directly to preferred pharmacy at discharge. Patient agreeable to plan. Given opportunity to ask questions. Appears to feel comfortable with discharge.    # In the event of worsening symptoms, the patient is instructed to call the crisis hotline, 911 and or go to the nearest ED for appropriate evaluation and treatment of symptoms. To follow-up with primary care provider for other medical issues, concerns and or health care needs  # Patient was discharged to home with a plan to follow up as noted below.   Addendum: Patient to follow up with outpatient PCP for elevated blood pressure 147/100 and all abnormal labs including lipid profile.  Physical Findings: AIMS:  , ,  ,  ,    CIWA:    COWS:     Musculoskeletal: Strength & Muscle Tone: within normal  limits Gait & Station: normal Patient leans: N/A  Psychiatric Specialty Exam:  Presentation  General Appearance:  Casual; Fairly Groomed  Eye Contact: Good  Speech: Clear and Coherent  Speech Volume: Normal  Handedness: Right  Mood and Affect  Mood: Euthymic  Affect: Congruent  Thought Process  Thought Processes: Coherent;  Linear  Descriptions of Associations:Intact  Orientation:Full (Time, Place and Person)  Thought Content:Logical  History of Schizophrenia/Schizoaffective disorder:No  Duration of Psychotic Symptoms:No data recorded Hallucinations:Hallucinations: None  Ideas of Reference:None  Suicidal Thoughts:Suicidal Thoughts: No SI Active Intent and/or Plan: -- (n/a)  Homicidal Thoughts:Homicidal Thoughts: No  Sensorium  Memory: Immediate Good; Recent Good  Judgment: Fair  Insight: Fair  Art therapist  Concentration: Good  Attention Span: Good  Recall: Fair  Fund of Knowledge: Fair  Language: Good  Psychomotor Activity  Psychomotor Activity: Psychomotor Activity: Normal  Assets  Assets: Communication Skills; Desire for Improvement; Physical Health; Resilience  Sleep  Sleep: Sleep: Good Number of Hours of Sleep: 9  Physical Exam: Physical Exam Vitals and nursing note reviewed.  HENT:     Head: Normocephalic.     Nose: Nose normal.     Mouth/Throat:     Mouth: Mucous membranes are moist.  Eyes:     Extraocular Movements: Extraocular movements intact.  Cardiovascular:     Rate and Rhythm: Normal rate.     Pulses: Normal pulses.  Pulmonary:     Effort: Pulmonary effort is normal.  Abdominal:     Comments: Deferred  Genitourinary:    Comments: Deferred Musculoskeletal:        General: Normal range of motion.     Cervical back: Normal range of motion.  Skin:    General: Skin is warm.  Neurological:     General: No focal deficit present.     Mental Status: He is alert and oriented to person, place, and time.  Psychiatric:        Mood and Affect: Mood normal.        Behavior: Behavior normal.        Thought Content: Thought content normal.        Judgment: Judgment normal.    Review of Systems  Constitutional:  Negative for chills and fever.  HENT:  Negative for sore throat.   Eyes:  Negative for blurred vision.  Respiratory:  Negative  for cough, sputum production, shortness of breath and wheezing.   Cardiovascular:  Negative for chest pain and palpitations.  Gastrointestinal:  Negative for abdominal pain, constipation, diarrhea, heartburn, nausea and vomiting.  Genitourinary: Negative.   Musculoskeletal: Negative.   Skin:  Negative for itching and rash.  Neurological:  Negative for dizziness, tingling, tremors, seizures and headaches.  Endo/Heme/Allergies:        See allergy listing  Psychiatric/Behavioral:  Positive for depression (Patient to follow up with outpatient therapy for depression).    Blood pressure (!) 147/100, pulse 87, temperature 98.4 F (36.9 C), temperature source Oral, resp. rate 18, height 6' (1.829 m), weight 136.1 kg, SpO2 100%. Body mass index is 40.69 kg/m.   Social History   Tobacco Use  Smoking Status Some Days   Current packs/day: 0.50   Types: Cigarettes  Smokeless Tobacco Not on file   Tobacco Cessation:  A prescription for an FDA-approved tobacco cessation medication was offered at discharge and the patient refused  Blood Alcohol level:  Lab Results  Component Value Date   [facility] <10 05/30/2023   Metabolic Disorder Labs:  Lab Results  Component Value Date   HGBA1C 5.0 05/30/2023   MPG 96.8 05/30/2023   No results found for: "PROLACTIN" Lab Results  Component Value Date   CHOL 196 05/30/2023   TRIG 148 05/30/2023   HDL 42 05/30/2023   CHOLHDL 4.7 05/30/2023   VLDL 30 05/30/2023   LDLCALC 124 (H) 05/30/2023   See Psychiatric Specialty Exam and Suicide Risk Assessment completed by Attending Physician prior to discharge.  Discharge destination:  Home  Is patient on multiple antipsychotic therapies at discharge:  No   Has Patient had three or more failed trials of antipsychotic monotherapy by history:  No  Recommended Plan for Multiple Antipsychotic Therapies: NA  Discharge Instructions     Increase activity slowly   Complete by: As directed       Allergies as  of 06/01/2023   No Known Allergies      Medication List    You have not been prescribed any medications.     Follow-up Information     [place] Go on 06/06/2023.   Specialty: Professional Counselor Why: Please go to this provider on 06/06/23 at 9:00 am to obtain therapy services.  You may also go during walk in hours for new patients:  Monday through Friday, from 9 am to 1 pm. Contact information: [address] [place] [address] [phone]         [facility]. Go on 06/14/2023.   Specialty: Behavioral Health Why: Please go to this provider for medication management services on 06/14/23 at 7:00 am. For fastest service, you may also go during walk in hours of Monday through Friday, arrive by 7:00 am, for same day service. Contact information: [address] [place] [address] [phone]                Follow-up recommendations:    Discharge Recommendations:  The patient is being discharged to home. Patient is to take his discharge medications as ordered.  See follow up above. We recommend that he participates in individual therapy to target uncontrollable agitation and substance abuse.  We recommend that he participates in therapy to target personal conflict, to improve communication skills and conflict resolution skills. Patient is to initiate/implement a contingency based behavioral model to address his behavior. We recommend that he gets AIMS scale, height, weight, blood pressure, fasting lipid panel, fasting blood sugar in three months from discharge if he's on atypical antipsychotics.  Patient will benefit from monitoring of recurrent suicidal ideation since patient is on antidepressant medication. The patient should abstain from all illicit substances and alcohol. If the patient's symptoms worsen or do not continue to improve or if the patient becomes actively suicidal or homicidal  then it is recommended that the patient return to the closest hospital emergency room or call 911 for further evaluation and treatment. National Suicide Prevention Lifeline 1800-SUICIDE or 587-126-2179. Please follow up with your primary medical doctor for all other medical needs.  The patient has been educated on the possible side effects to medications and she/her guardian is to contact a medical professional and inform outpatient provider of any new side effects of medication. He is to take regular diet and activity as tolerated.  Will benefit from moderate daily exercise. Patient and Family was educated about removing/locking any firearms, medications or dangerous products from the home.  Activity:  As tolerated Diet:  Regular Diet   Signed: Cecilie Lowers, FNP 06/01/2023, 10:46 AM

## 2023-06-01 NOTE — Progress Notes (Signed)
Discharge Note:  Patient denies SI/HI/AVH at this time. Discharge instructions, AVS, prescriptions, and transition record gone over with patient. Patient agrees to adhere with medication management, follow-up visit, and outpatient therapy. Patient belongings returned to patient. Patient questions and concerns addressed and answered. Patient ambulatory off unit. Patient discharged to home with parents.

## 2023-06-01 NOTE — [facility] Suicide Risk Assessment (Signed)
Suicide Risk Assessment  Discharge Assessment    [facility] Discharge Suicide Risk Assessment   Principal Problem: MDD (major depressive disorder), recurrent episode, severe (HCC) Discharge Diagnoses: Principal Problem:   MDD (major depressive disorder), recurrent episode, severe (HCC)  Reason for admission:   Francis Reyes is a 27 years old AA male with no formal psychiatric diagnoses who presents involuntarily to [facility] from [facility] ED for worsening depression resulting in suicidal ideation in the context of psychosocial stressors of unemployment, withdrawal from the trade school, misdemeanor charge, not being able to continue kicks boxing activities due to lack of transportation, strained relationship from father at early age, and trauma from dad and mom's separation and divorce at early age.   Total Time spent with patient: 30 minutes  Musculoskeletal: Strength & Muscle Tone: within normal limits Gait & Station: normal Patient leans: N/A  Psychiatric Specialty Exam  Presentation  General Appearance:  Casual; Fairly Groomed  Eye Contact: Good  Speech: Clear and Coherent  Speech Volume: Normal  Handedness: Right  Mood and Affect  Mood: Euthymic  Duration of Depression Symptoms: Greater than two weeks  Affect: Congruent  Thought Process  Thought Processes: Coherent; Linear  Descriptions of Associations:Intact  Orientation:Full (Time, Place and Person)  Thought Content:Logical  History of Schizophrenia/Schizoaffective disorder:No  Duration of Psychotic Symptoms:No data recorded Hallucinations:Hallucinations: None  Ideas of Reference:None  Suicidal Thoughts:Suicidal Thoughts: No SI Active Intent and/or Plan: -- (n/a)  Homicidal Thoughts:Homicidal Thoughts: No  Sensorium  Memory: Immediate Good; Recent Good  Judgment: Fair  Insight: Fair  Art therapist  Concentration: Good  Attention Span: Good  Recall: Fair  Fund  of Knowledge: Fair  Language: Good  Psychomotor Activity  Psychomotor Activity: Psychomotor Activity: Normal  Assets  Assets: Communication Skills; Desire for Improvement; Physical Health; Resilience  Sleep  Sleep: Sleep: Good Number of Hours of Sleep: 9  Physical Exam: Physical Exam Vitals and nursing note reviewed.  Constitutional:      General: He is not in acute distress.    Appearance: He is not toxic-appearing.  HENT:     Head: Normocephalic.     Mouth/Throat:     Mouth: Mucous membranes are moist.  Eyes:     Extraocular Movements: Extraocular movements intact.  Cardiovascular:     Rate and Rhythm: Normal rate.     Pulses: Normal pulses.  Pulmonary:     Effort: Pulmonary effort is normal.  Abdominal:     Comments: Deferred  Genitourinary:    Comments: Deferred Musculoskeletal:        General: Normal range of motion.     Cervical back: Normal range of motion.  Skin:    General: Skin is warm.  Neurological:     General: No focal deficit present.     Mental Status: He is alert and oriented to person, place, and time.  Psychiatric:        Mood and Affect: Mood normal.        Behavior: Behavior normal.        Thought Content: Thought content normal.        Judgment: Judgment normal.    Review of Systems  Constitutional:  Negative for chills and fever.  HENT:  Negative for sore throat.   Eyes:  Negative for blurred vision.  Respiratory:  Negative for cough, sputum production, shortness of breath and wheezing.   Cardiovascular:  Negative for chest pain and palpitations.  Gastrointestinal:  Negative for abdominal pain, constipation, diarrhea, heartburn,  nausea and vomiting.  Genitourinary: Negative.  Negative for dysuria, frequency and urgency.  Musculoskeletal: Negative.   Skin:  Negative for itching and rash.  Neurological:  Negative for dizziness, tingling, tremors, seizures and headaches.  Endo/Heme/Allergies:        See allergy listing   Psychiatric/Behavioral:  Positive for depression (Patient to follow up with outpatient resources for therapy).    Blood pressure (!) 147/100, pulse 87, temperature 98.4 F (36.9 C), temperature source Oral, resp. rate 18, height 6' (1.829 m), weight 136.1 kg, SpO2 100%. Body mass index is 40.69 kg/m.  Addendum: Patient to follow up with outpatient PCP for monitoring of blood pressure and abnormal labs including lipid profile  Mental Status Per Nursing Assessment::   On Admission:  Suicidal ideation indicated by patient  Demographic Factors:  Male, Adolescent or young adult, Low socioeconomic status, and Unemployed  Loss Factors: Decrease in vocational status, Legal issues, and Financial problems/change in socioeconomic status  Historical Factors: Prior suicide attempts and Impulsivity  Risk Reduction Factors:   Living with another person, especially a relative, Positive social support, Positive therapeutic relationship, and Positive coping skills or problem solving skills  Continued Clinical Symptoms:  Depression:   Recent sense of peace/wellbeing  Cognitive Features That Contribute To Risk:  Polarized thinking    Suicide Risk:  Mild: There are no identifiable plans, no associated intent, mild dysphoria and related symptoms, good self-control (both objective and subjective assessment), few other risk factors, and identifiable protective factors, including available and accessible social support.   Follow-up Information     [place] Go on 06/06/2023.   Specialty: Professional Counselor Why: Please go to this provider on 06/06/23 at 9:00 am to obtain therapy services.  You may also go during walk in hours for new patients:  Monday through Friday, from 9 am to 1 pm. Contact information: [address] [place] [address] [phone]         [facility]. Go on 06/14/2023.   Specialty: Behavioral  Health Why: Please go to this provider for medication management services on 06/14/23 at 7:00 am. For fastest service, you may also go during walk in hours of Monday through Friday, arrive by 7:00 am, for same day service. Contact information: [address] [place] [address] [phone]                Plan Of Care/Follow-up recommendations:  Discharge Recommendations:  The patient is being discharged to home. Patient is to take his discharge medications as ordered.  See follow up above. We recommend that he participates in individual therapy to target uncontrollable agitation and substance abuse.  We recommend that he participates in therapy to target personal conflict, to improve communication skills and conflict resolution skills. Patient is to initiate/implement a contingency based behavioral model to address his behavior. We recommend that he gets AIMS scale, height, weight, blood pressure, fasting lipid panel, fasting blood sugar in three months from discharge if he's on atypical antipsychotics.  Patient will benefit from monitoring of recurrent suicidal ideation since patient is on antidepressant medication. The patient should abstain from all illicit substances and alcohol. If the patient's symptoms worsen or do not continue to improve or if the patient becomes actively suicidal or homicidal then it is recommended that the patient return to the closest hospital emergency room or call 911 for further evaluation and treatment. National Suicide Prevention Lifeline 1800-SUICIDE or 478-582-4190. Please follow up with your primary  medical doctor for all other medical needs.  The patient has been educated on the possible side effects to medications and she/her guardian is to contact a medical professional and inform outpatient provider of any new side effects of medication. He is to take regular diet and activity as tolerated.  Will benefit from moderate daily  exercise. Patient and Family was educated about removing/locking any firearms, medications or dangerous products from the home.  Activity:  As tolerated Diet:  Regular Diet   Cecilie Lowers, FNP 06/01/2023, 10:35 AM

## 2023-06-01 NOTE — Group Note (Signed)
Recreation Therapy Group Note   Group Topic:Team Building  Group Date: 06/01/2023 Start Time: 0930 End Time: 0952 Facilitators: Breland Elders-McCall, LRT,CTRS Location: [address] Dayroom   Group Topic: Communication, Team Building, Problem Solving  Goal Area(s) Addresses:  Patient will effectively work with peer towards shared goal.  Patient will identify skills used to make activity successful.  Patient will share challenges and verbalize solution-driven approaches used. Patient will identify how skills used during activity can be used to reach post d/c goals.   Intervention: STEM Activity   Group Description: [employer]. Patients were provided the following materials: 4 drinking straws, 5 rubber bands, 5 paper clips, 2 index cards and 2 drinking cups. Using the provided materials patients were asked to build a launching mechanism to launch a ping pong ball across the room, approximately 10 feet. Patients were divided into teams of 3-5. Instructions required all materials be incorporated into the device, functionality of items left to the peer group's discretion.  Education: Pharmacist, community, Scientist, physiological, Air cabin crew, Building control surveyor.   Education Outcome: Acknowledges education/In group clarification offered/Needs additional education.    Affect/Mood: N/A   Participation Level: Did not attend    Clinical Observations/Individualized Feedback:    Plan: Continue to engage patient in RT group sessions 2-3x/week.   Essex Perry-McCall, LRT,CTRS 06/01/2023 11:21 AM

## 2023-06-01 NOTE — Plan of Care (Signed)
  Problem: Safety: Goal: Periods of time without injury will increase Outcome: Progressing   

## 2023-06-01 NOTE — Progress Notes (Signed)
   06/01/23 0600  15 Minute Checks  Location Bedroom  Visual Appearance Calm  Behavior Composed  Sleep (Behavioral Health Patients Only)  Calculate sleep? (Click Yes once per 24 hr at 0600 safety check) Yes  Documented sleep last 24 hours 9

## 2023-06-01 NOTE — Progress Notes (Signed)
   06/01/23 0900  Psych Admission Type (Psych Patients Only)  Admission Status Involuntary  Psychosocial Assessment  Patient Complaints None  Eye Contact Fair  Facial Expression Flat  Affect Flat  Speech Logical/coherent  Interaction Guarded  Motor Activity Other (Comment) (wnl)  Appearance/Hygiene Unremarkable  Behavior Characteristics Guarded  Mood Preoccupied  Thought Process  Coherency WDL  Content WDL  Delusions None reported or observed  Perception WDL  Hallucination None reported or observed  Judgment Impaired  Confusion None  Danger to Self  Current suicidal ideation? Denies  Agreement Not to Harm Self Yes  Description of Agreement verbal  Danger to Others  Danger to Others None reported or observed

## 2024-04-18 ENCOUNTER — Ambulatory Visit (INDEPENDENT_AMBULATORY_CARE_PROVIDER_SITE_OTHER): Payer: Self-pay

## 2024-04-18 ENCOUNTER — Ambulatory Visit (HOSPITAL_COMMUNITY)
Admission: EM | Admit: 2024-04-18 | Discharge: 2024-04-18 | Disposition: A | Discharge status: Home / Self Care | Payer: Self-pay | Attending: Family Medicine | Admitting: Family Medicine

## 2024-04-18 ENCOUNTER — Encounter (HOSPITAL_COMMUNITY): Payer: Self-pay

## 2024-04-18 DIAGNOSIS — R0602 Shortness of breath: Secondary | ICD-10-CM

## 2024-04-18 DIAGNOSIS — R6889 Other general symptoms and signs: Secondary | ICD-10-CM

## 2024-04-18 DIAGNOSIS — J029 Acute pharyngitis, unspecified: Secondary | ICD-10-CM

## 2024-04-18 LAB — POC COVID19/FLU A&B COMBO
Covid Antigen, POC: NEGATIVE
Influenza A Antigen, POC: NEGATIVE
Influenza B Antigen, POC: NEGATIVE

## 2024-04-18 LAB — POCT RAPID STREP A (OFFICE): Rapid Strep A Screen: NEGATIVE

## 2024-04-18 MED ORDER — AMOXICILLIN 875 MG PO TABS: 875.0000 mg | ORAL_TABLET | Freq: Two times a day (BID) | ORAL | 0 refills | Status: AC

## 2024-04-18 NOTE — Medical Student Note (Signed)
 [facility] Insurance account manager Note For educational purposes for Medical, PA and NP students only and not part of the legal medical record.   CSN: [id number] Arrival date & time: 04/18/24  0830      History   Chief Complaint Chief Complaint  Patient presents with   Cough    HPI Francis Reyes is a 28 y.o. male.  Pt has a hx of MDD and presents with a productive cough with ShOB. He reports that last week he had a sore throat, chills, and body aches. He was taking Nyquil and Dayquil for symptoms and he was feeling better on Monday (10/20). Then yesterday he started getting worse again and developed a productive cough with some ShOB after coughing episodes. He has some associated upper mid chest and throat pain that is only present during coughing. Pain and ShOB is not brought on with exertion or during rest.   The history is provided by the patient.  Cough Associated symptoms: chest pain, chills, myalgias, shortness of breath and sore throat   Associated symptoms: no fever, no rhinorrhea and no wheezing     Past Medical History:  Diagnosis Date   Kidney stones     Patient Active Problem List   Diagnosis Date Noted   MDD (major depressive disorder), recurrent episode, severe (HCC) 05/30/2023   Other acne 07/16/2010    History reviewed. No pertinent surgical history.     Home Medications    Prior to Admission medications   Not on File    Family History History reviewed. No pertinent family history.  Social History Social History   Tobacco Use   Smoking status: Some Days    Current packs/day: 0.50    Types: Cigarettes  Vaping Use   Vaping status: Never Used  Substance Use Topics   Alcohol use: Yes    Comment: occasionally   Drug use: Not Currently     Allergies   Patient has no known allergies.   Review of Systems Review of Systems  Constitutional:  Positive for chills. Negative for fever.  HENT:  Positive for sore throat.  Negative for rhinorrhea and trouble swallowing.   Respiratory:  Positive for cough (productive with green sputum) and shortness of breath. Negative for wheezing.   Cardiovascular:  Positive for chest pain.  Gastrointestinal:  Negative for diarrhea, nausea and vomiting.  Musculoskeletal:  Positive for myalgias.     Physical Exam Updated Vital Signs BP 119/78 (BP Location: Left Arm)   Pulse 91   Temp 98.1 F (36.7 C) (Oral)   Resp 18   SpO2 97%   Physical Exam Constitutional:      Appearance: Normal appearance.  HENT:     Right Ear: Tympanic membrane, ear canal and external ear normal.     Left Ear: Tympanic membrane, ear canal and external ear normal.     Nose: Nose normal.     Mouth/Throat:     Mouth: Mucous membranes are moist.     Pharynx: Uvula midline. Oropharyngeal exudate (1-2 mm white patch onted on R tonsil) and posterior oropharyngeal erythema present.     Tonsils: 1+ on the right. 1+ on the left.  Cardiovascular:     Rate and Rhythm: Normal rate and regular rhythm.     Pulses: Normal pulses.     Heart sounds: Normal heart sounds.  Pulmonary:     Effort: Pulmonary effort is normal.     Breath sounds: Normal breath sounds.  Abdominal:  General: Bowel sounds are normal.     Palpations: Abdomen is soft.     Tenderness: There is no abdominal tenderness.  Musculoskeletal:     Cervical back: Neck supple.  Lymphadenopathy:     Cervical: Cervical adenopathy present.  Neurological:     Mental Status: He is alert.      ED Treatments / Results  Labs (all labs ordered are listed, but only abnormal results are displayed) Labs Reviewed  POC COVID19/FLU A&B COMBO  POCT RAPID STREP A (OFFICE)    EKG  Radiology No results found.  Procedures Procedures (including critical care time)  Medications Ordered in ED Medications - No data to display   Initial Impression / Assessment and Plan / ED Course  I have reviewed the triage vital signs and the nursing  notes.  Pertinent labs & imaging results that were available during my care of the patient were reviewed by me and considered in my medical decision making (see chart for details).     POC COVID19/Flu negative Rapid Strep negative CXR negative  Phayngitis/Bronchitis  Considering the double worsening of symptoms with treat for bacterial etiology. Will start amoxicillin 875 mg BID for 7 days. Guaifenesin  OTC PRN for cough.  Counseled pt on infection prevention and hydrating with at least 64 oz of water daily. If symptoms do not resolve or they worsen, then follow up. Red flag symptoms reviewed and return precautions given.    Final Clinical Impressions(s) / ED Diagnoses   Final diagnoses:  Not feeling great  SOB (shortness of breath)    New Prescriptions New Prescriptions   No medications on file

## 2024-04-18 NOTE — ED Provider Notes (Signed)
 [facility]   [id number] 04/18/24 Arrival Time: 0830     History              Chief Complaint    Chief Complaint  Patient presents with   Cough      HPI Francis Reyes is a 28 y.o. male.   Pt has a hx of MDD and presents with a productive cough with ShOB. He reports that last week he had a sore throat, chills, and body aches. He was taking Nyquil and Dayquil for symptoms and he was feeling better on Monday (10/20). Then yesterday he started getting worse again and developed a productive cough with some ShOB after coughing episodes. He has some associated upper mid chest and throat pain that is only present during coughing. Pain and ShOB is not brought on with exertion or during rest.   The history is provided by the patient.  Cough Associated symptoms: chest pain, chills, myalgias, shortness of breath and sore throat   Associated symptoms: no fever, no rhinorrhea and no wheezing          Past Medical History:  Diagnosis Date   Kidney stones                Patient Active Problem List    Diagnosis Date Noted   MDD (major depressive disorder), recurrent episode, severe (HCC) 05/30/2023   Other acne 07/16/2010      History reviewed. No pertinent surgical history.             Home Medications                Prior to Admission medications   Not on File      Family History History reviewed. No pertinent family history.       Social History Social History  Social History         Tobacco Use   Smoking status: Some Days      Current packs/day: 0.50      Types: Cigarettes  Vaping Use   Vaping status: Never Used  Substance Use Topics   Alcohol use: Yes      Comment: occasionally   Drug use: Not Currently          Allergies              Patient has no known allergies.     Review of Systems Review of Systems  Constitutional:  Positive for chills. Negative for fever.  HENT:  Positive for sore throat. Negative for rhinorrhea and  trouble swallowing.   Respiratory:  Positive for cough (productive with green sputum) and shortness of breath. Negative for wheezing.   Cardiovascular:  Positive for chest pain.  Gastrointestinal:  Negative for diarrhea, nausea and vomiting.  Musculoskeletal:  Positive for myalgias.       Physical Exam Updated Vital Signs BP 119/78 (BP Location: Left Arm)   Pulse 91   Temp 98.1 F (36.7 C) (Oral)   Resp 18   SpO2 97%    Physical Exam Constitutional:      Appearance: Normal appearance.  HENT:     Right Ear: Tympanic membrane, ear canal and external ear normal.     Left Ear: Tympanic membrane, ear canal and external ear normal.     Nose: Nose normal.     Mouth/Throat:     Mouth: Mucous membranes are moist.     Pharynx: Uvula midline. Oropharyngeal exudate (1-2 mm white patch onted on R tonsil)  and posterior oropharyngeal erythema present.     Tonsils: 1+ on the right. 1+ on the left.  Cardiovascular:     Rate and Rhythm: Normal rate and regular rhythm.     Pulses: Normal pulses.     Heart sounds: Normal heart sounds.  Pulmonary:     Effort: Pulmonary effort is normal.     Breath sounds: Normal breath sounds.  Abdominal:     General: Bowel sounds are normal.     Palpations: Abdomen is soft.     Tenderness: There is no abdominal tenderness.  Musculoskeletal:     Cervical back: Neck supple.  Lymphadenopathy:     Cervical: Cervical adenopathy present.  Neurological:     Mental Status: He is alert.         ED Treatments / Results  Labs (all labs ordered are listed, but only abnormal results are displayed) Labs Reviewed  POC COVID19/FLU A&B COMBO  POCT RAPID STREP A (OFFICE)      EKG   Radiology Imaging Results (Last 48 hours)  No results found.     Procedures Procedures (including critical care time)   Medications Ordered in ED Medications - No data to display     Initial Impression / Assessment and Plan / ED Course  I have reviewed the triage vital  signs and the nursing notes.   Pertinent labs & imaging results that were available during my care of the patient were reviewed by me and considered in my medical decision making (see chart for details).     POC COVID19/Flu negative Rapid Strep negative CXR negative   Phayngitis/Bronchitis   Considering the double worsening of symptoms with treat for bacterial etiology. Will start amoxicillin 875 mg BID for 7 days. Guaifenesin  OTC PRN for cough.  Counseled pt on infection prevention and hydrating with at least 64 oz of water daily. If symptoms do not resolve or they worsen, then follow up. Red flag symptoms reviewed and return precautions given.      Final Clinical Impressions(s) / ED Diagnoses    Final diagnoses:  Not feeling great  SOB (shortness of breath)     I have personally viewed and independently interpreted the imaging studies ordered this visit. CXR: no acute changes.  Given duration of illness and progressing symptoms, will cover with: Meds ordered this encounter  Medications   amoxicillin (AMOXIL) 875 MG tablet    Sig: Take 1 tablet (875 mg total) by mouth 2 (two) times daily for 7 days.    Dispense:  14 tablet    Refill:  0    Follow-up Information     [facility].   Specialty: Urgent Care Why: If worsening or failing to improve as anticipated. Contact information: [address] [place]  [address] [phone]                      Rolinda Rogue, MD 04/18/24 1325

## 2024-04-18 NOTE — ED Triage Notes (Signed)
 Pt c/o cough and post nasal drip since yesterday. States feels SOB at times from coughing. States took mucinex  and used cough drops with no relief.
